# Patient Record
Sex: Male | Born: 1940 | Race: White | Hispanic: No | Marital: Married | Smoking: Former smoker
Health system: Southern US, Community
[De-identification: ages and names within clinical notes are randomized; demographics above are authoritative.]

## PROBLEM LIST (undated history)

## (undated) DIAGNOSIS — F6381 Intermittent explosive disorder: Secondary | ICD-10-CM

## (undated) DIAGNOSIS — D6851 Activated protein C resistance: Secondary | ICD-10-CM

## (undated) DIAGNOSIS — M1990 Unspecified osteoarthritis, unspecified site: Secondary | ICD-10-CM

## (undated) DIAGNOSIS — Z5181 Encounter for therapeutic drug level monitoring: Secondary | ICD-10-CM

## (undated) DIAGNOSIS — Z7901 Long term (current) use of anticoagulants: Secondary | ICD-10-CM

## (undated) DIAGNOSIS — I2510 Atherosclerotic heart disease of native coronary artery without angina pectoris: Secondary | ICD-10-CM

## (undated) DIAGNOSIS — F329 Major depressive disorder, single episode, unspecified: Secondary | ICD-10-CM

## (undated) DIAGNOSIS — G4700 Insomnia, unspecified: Secondary | ICD-10-CM

## (undated) DIAGNOSIS — F909 Attention-deficit hyperactivity disorder, unspecified type: Secondary | ICD-10-CM

## (undated) DIAGNOSIS — E119 Type 2 diabetes mellitus without complications: Secondary | ICD-10-CM

## (undated) DIAGNOSIS — I10 Essential (primary) hypertension: Secondary | ICD-10-CM

## (undated) DIAGNOSIS — E299 Testicular dysfunction, unspecified: Secondary | ICD-10-CM

## (undated) DIAGNOSIS — Z8619 Personal history of other infectious and parasitic diseases: Secondary | ICD-10-CM

## (undated) DIAGNOSIS — G4733 Obstructive sleep apnea (adult) (pediatric): Secondary | ICD-10-CM

## (undated) DIAGNOSIS — J449 Chronic obstructive pulmonary disease, unspecified: Secondary | ICD-10-CM

## (undated) DIAGNOSIS — E785 Hyperlipidemia, unspecified: Secondary | ICD-10-CM

## (undated) DIAGNOSIS — F32A Depression, unspecified: Secondary | ICD-10-CM

## (undated) DIAGNOSIS — U071 COVID-19: Secondary | ICD-10-CM

## (undated) HISTORY — DX: Encounter for therapeutic drug level monitoring: Z51.81

## (undated) HISTORY — DX: Chronic obstructive pulmonary disease, unspecified: J44.9

## (undated) HISTORY — DX: Obstructive sleep apnea (adult) (pediatric): G47.33

## (undated) HISTORY — DX: Atherosclerotic heart disease of native coronary artery without angina pectoris: I25.10

## (undated) HISTORY — DX: Activated protein C resistance: D68.51

## (undated) HISTORY — DX: Unspecified osteoarthritis, unspecified site: M19.90

## (undated) HISTORY — DX: COVID-19: U07.1

## (undated) HISTORY — DX: Intermittent explosive disorder: F63.81

## (undated) HISTORY — DX: Long term (current) use of anticoagulants: Z79.01

## (undated) HISTORY — PX: HEMORRHOID SURGERY: SHX153

## (undated) HISTORY — DX: Essential (primary) hypertension: I10

## (undated) HISTORY — DX: Testicular dysfunction, unspecified: E29.9

## (undated) HISTORY — DX: Depression, unspecified: F32.A

## (undated) HISTORY — DX: Type 2 diabetes mellitus without complications: E11.9

## (undated) HISTORY — DX: Hyperlipidemia, unspecified: E78.5

## (undated) HISTORY — DX: Attention-deficit hyperactivity disorder, unspecified type: F90.9

## (undated) HISTORY — DX: Insomnia, unspecified: G47.00

## (undated) HISTORY — DX: Personal history of other infectious and parasitic diseases: Z86.19

## (undated) HISTORY — PX: UVULOPLASTY: SHX6557

## (undated) HISTORY — PX: BACK SURGERY: SHX140

## (undated) HISTORY — DX: Major depressive disorder, single episode, unspecified: F32.9

---

## 1993-03-10 HISTORY — PX: CORONARY ANGIOPLASTY WITH STENT PLACEMENT: SHX49

## 2005-05-19 ENCOUNTER — Ambulatory Visit: Payer: Self-pay | Admitting: General Surgery

## 2008-07-17 ENCOUNTER — Ambulatory Visit: Payer: Self-pay | Admitting: Unknown Physician Specialty

## 2010-01-23 ENCOUNTER — Inpatient Hospital Stay: Payer: Self-pay | Admitting: Internal Medicine

## 2010-04-29 ENCOUNTER — Ambulatory Visit: Payer: Self-pay | Admitting: Family Medicine

## 2011-07-04 ENCOUNTER — Emergency Department: Payer: Self-pay | Admitting: *Deleted

## 2011-07-04 LAB — COMPREHENSIVE METABOLIC PANEL
Albumin: 4.4 g/dL (ref 3.4–5.0)
Alkaline Phosphatase: 56 U/L (ref 50–136)
Anion Gap: 6 — ABNORMAL LOW (ref 7–16)
Bilirubin,Total: 1.4 mg/dL — ABNORMAL HIGH (ref 0.2–1.0)
Calcium, Total: 8.9 mg/dL (ref 8.5–10.1)
Osmolality: 278 (ref 275–301)
Potassium: 4.7 mmol/L (ref 3.5–5.1)
SGOT(AST): 48 U/L — ABNORMAL HIGH (ref 15–37)
Sodium: 138 mmol/L (ref 136–145)

## 2011-07-04 LAB — PROTIME-INR: INR: 2.2

## 2011-07-04 LAB — CBC
HCT: 41.8 % (ref 40.0–52.0)
MCH: 29.3 pg (ref 26.0–34.0)
MCHC: 32.3 g/dL (ref 32.0–36.0)
MCV: 91 fL (ref 80–100)
Platelet: 229 10*3/uL (ref 150–440)
RDW: 14 % (ref 11.5–14.5)
WBC: 10.1 10*3/uL (ref 3.8–10.6)

## 2011-07-04 LAB — APTT: Activated PTT: 41.7 secs — ABNORMAL HIGH (ref 23.6–35.9)

## 2011-07-05 ENCOUNTER — Emergency Department: Payer: Self-pay | Admitting: Emergency Medicine

## 2011-11-24 ENCOUNTER — Emergency Department: Payer: Self-pay | Admitting: *Deleted

## 2011-11-24 LAB — COMPREHENSIVE METABOLIC PANEL
Albumin: 4.1 g/dL (ref 3.4–5.0)
Alkaline Phosphatase: 78 U/L (ref 50–136)
Anion Gap: 8 (ref 7–16)
BUN: 18 mg/dL (ref 7–18)
Bilirubin,Total: 1.8 mg/dL — ABNORMAL HIGH (ref 0.2–1.0)
Calcium, Total: 9.2 mg/dL (ref 8.5–10.1)
Chloride: 103 mmol/L (ref 98–107)
Creatinine: 1 mg/dL (ref 0.60–1.30)
EGFR (Non-African Amer.): >60
Glucose: 122 mg/dL — ABNORMAL HIGH (ref 65–99)
Potassium: 4.7 mmol/L (ref 3.5–5.1)
SGOT(AST): 54 U/L — ABNORMAL HIGH (ref 15–37)
SGPT (ALT): 42 U/L (ref 12–78)
Total Protein: 7.9 g/dL (ref 6.4–8.2)

## 2011-11-24 LAB — CLOSTRIDIUM DIFFICILE BY PCR

## 2011-11-24 LAB — CBC
HGB: 13.7 g/dL (ref 13.0–18.0)
MCH: 30 pg (ref 26.0–34.0)
RBC: 4.58 10*6/uL (ref 4.40–5.90)
RDW: 14.6 % — ABNORMAL HIGH (ref 11.5–14.5)

## 2011-11-24 LAB — PROTIME-INR
INR: 1.4
Prothrombin Time: 17.2 secs — ABNORMAL HIGH (ref 11.5–14.7)

## 2011-12-15 LAB — STOOL CULTURE

## 2013-04-08 ENCOUNTER — Other Ambulatory Visit: Payer: Self-pay | Admitting: Gastroenterology

## 2013-04-08 LAB — CLOSTRIDIUM DIFFICILE(ARMC)

## 2013-04-09 LAB — WBCS, STOOL

## 2013-04-10 LAB — STOOL CULTURE

## 2013-04-23 IMAGING — US US EXTREM LOW VENOUS*L*
1 series · 17 of 24 positions shown · non-contrast
Comparison: none

REASON FOR EXAM: painful LLL, hx DVT
COMMENTS:

PROCEDURE:     US  - US DOPPLER LOW EXTR LEFT  - July 04, 2011  [DATE]
RESULT:     Comparison: None

[Series 1: us extrem low venous*left* · 17 of 52 slices shown]
[im 1/52]
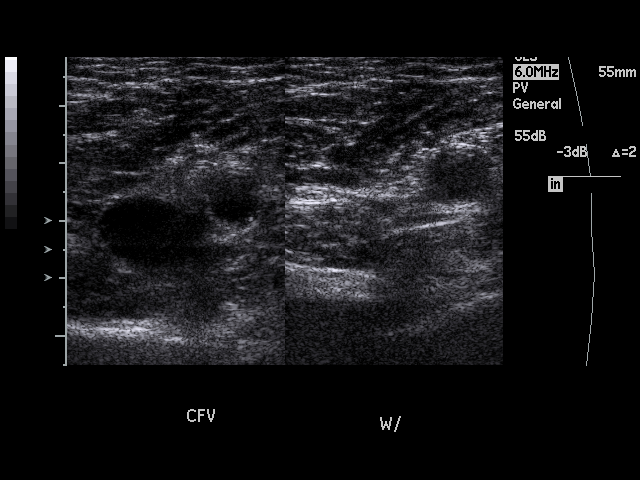
[im 5/52]
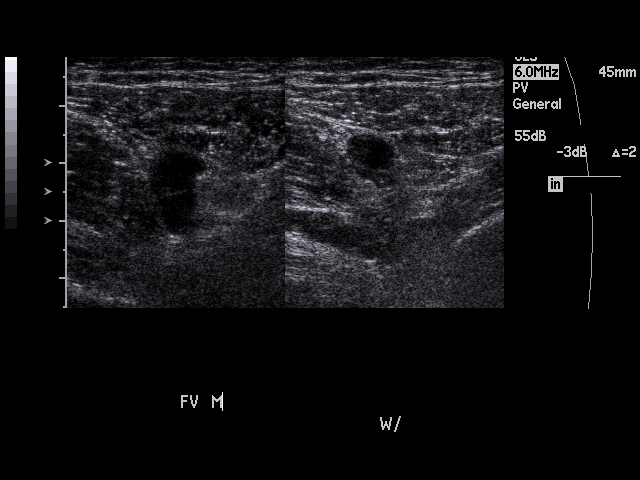
[im 7/52]
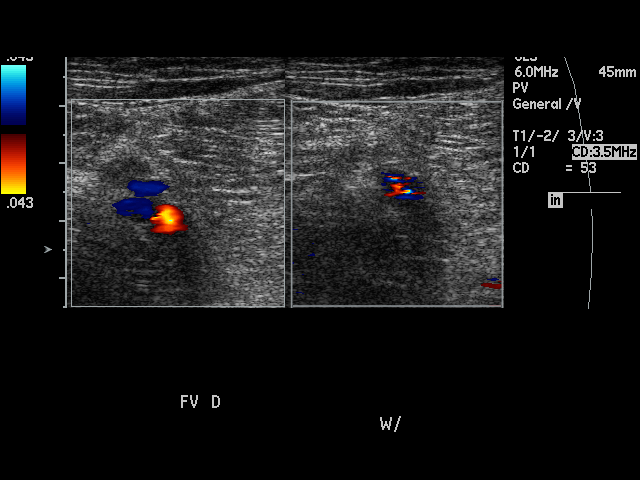
[im 9/52]
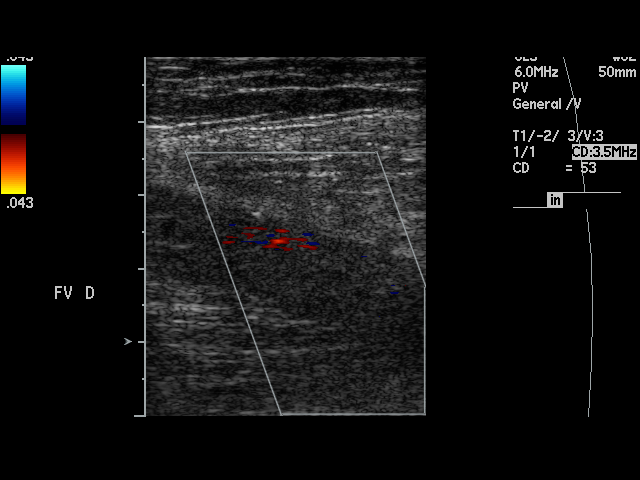
[im 14/52]
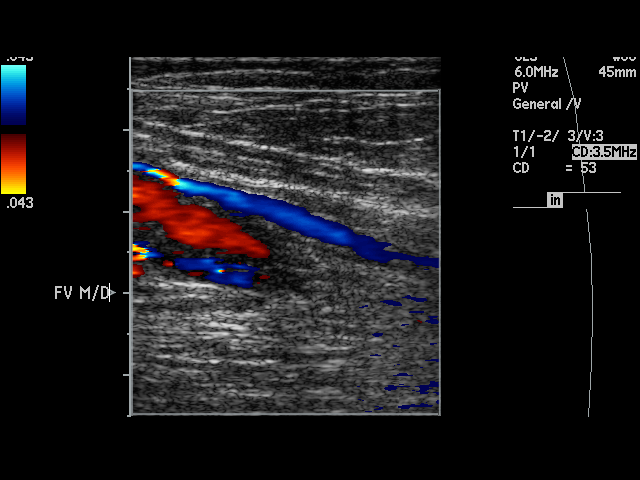
[im 16/52]
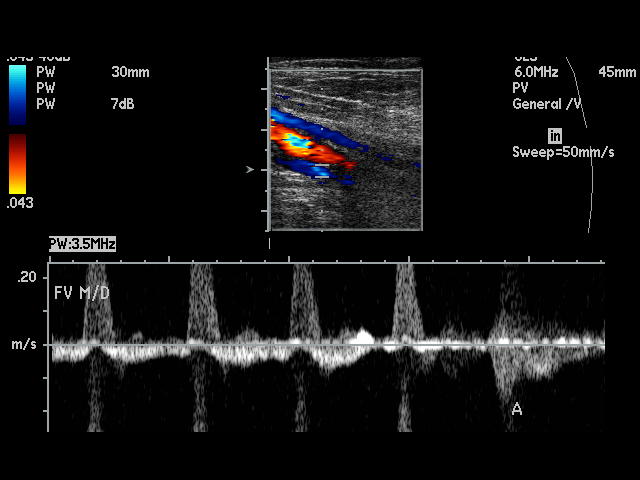
[im 20/52]
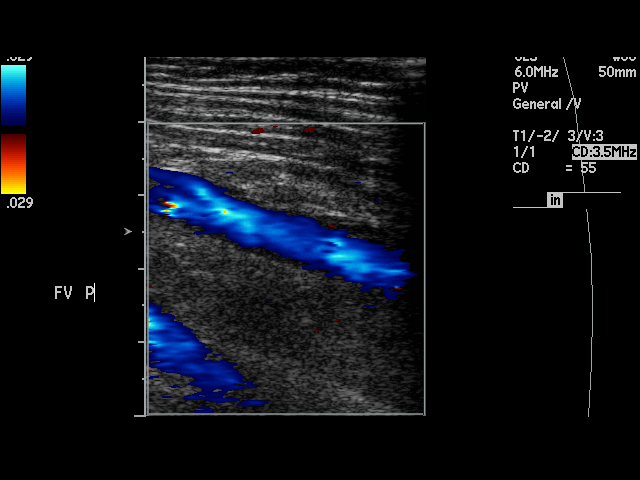
[im 23/52]
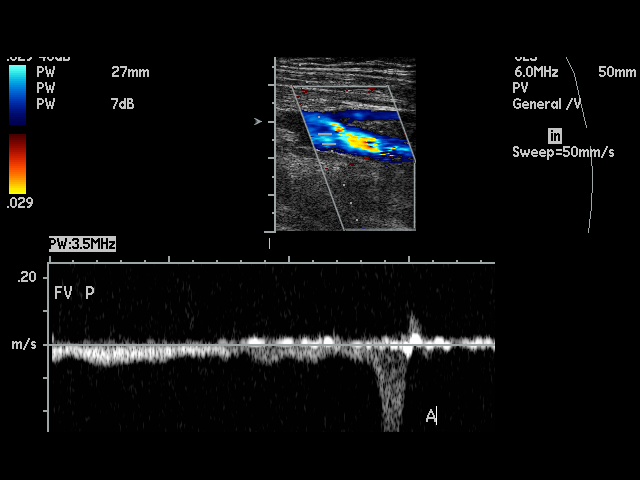
[im 27/52]
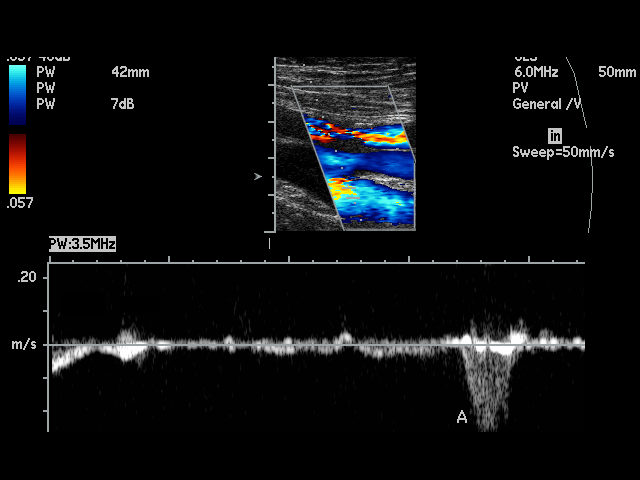
[im 29/52]
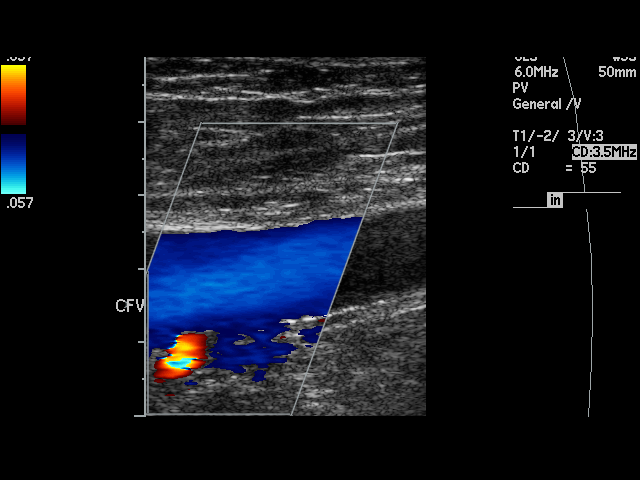
[im 32/52]
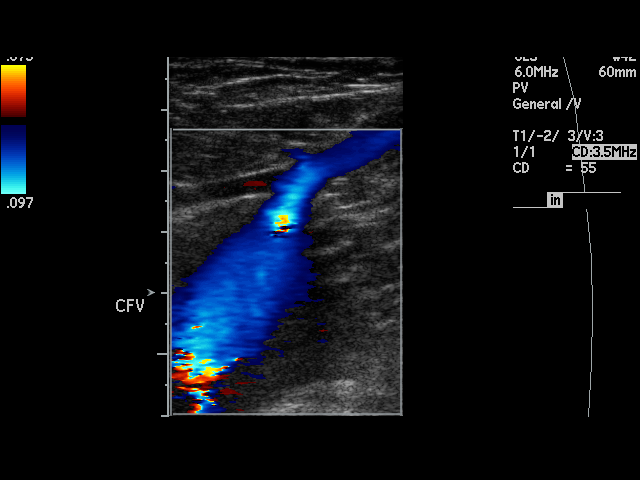
[im 36/52]
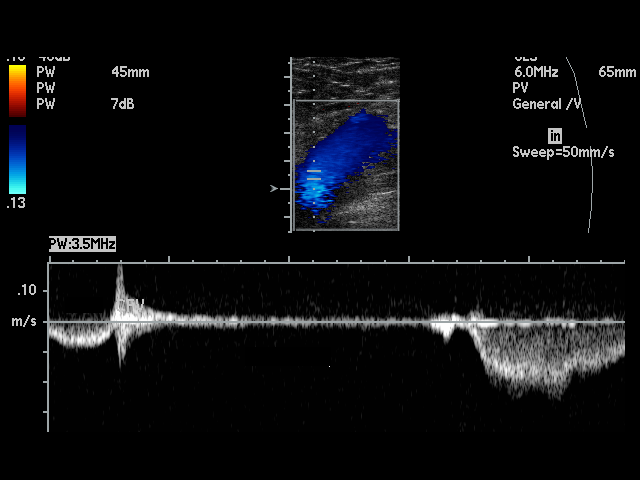
[im 38/52]
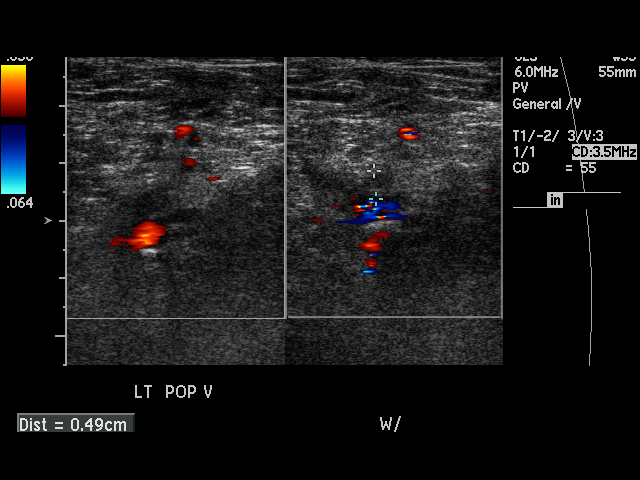
[im 43/52]
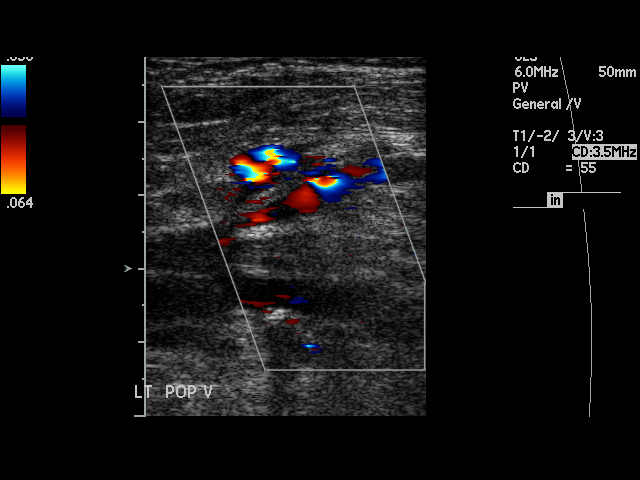
[im 45/52]
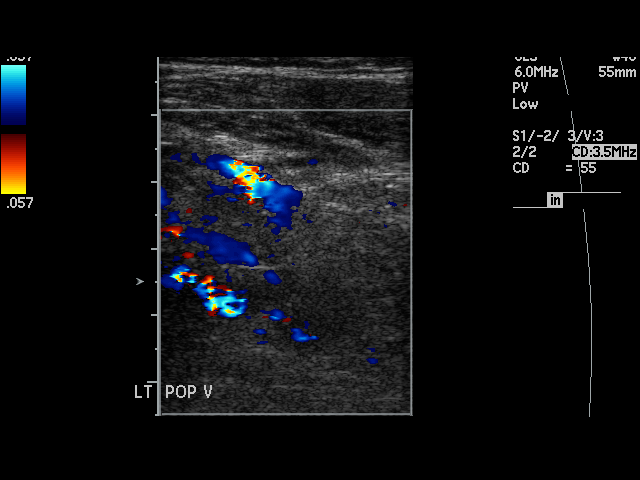
[im 47/52]
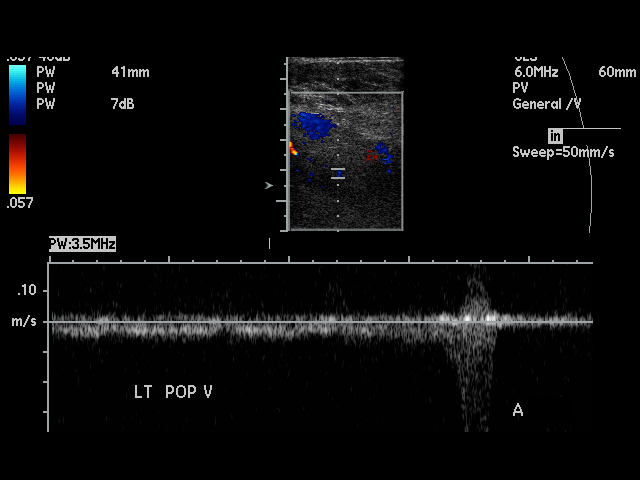
[im 52/52]
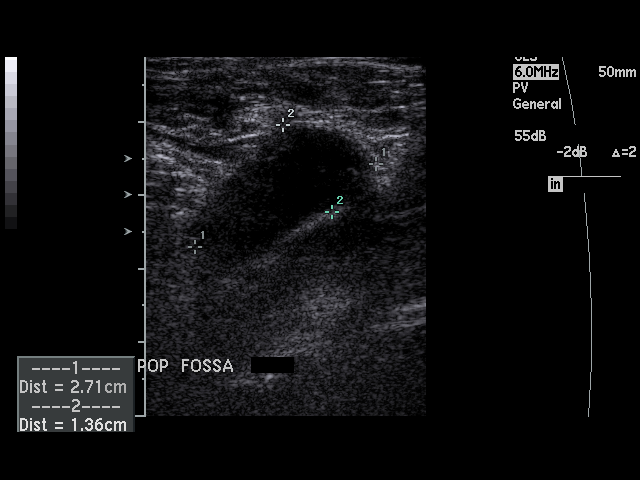

[17 of 24 positions shown; findings below may reference images not displayed]

FINDINGS: Multiple longitudinal and transverse gray-scale as well as color
and spectral Doppler images of the left lower extremity veins were obtained
from the common femoral veins through the popliteal veins.

In the left common femoral vein and popliteal vein are normally compressible
without evidence of thrombus. The superficial femoral vein is partially
compressible with color flow demonstrated to this region consistent with a
nonocclusive thrombus. There are numerous collateral vessels in this region.
There is normal respiratory variation and augmentation demonstrated at all
vein levels.

There is a 2.7 cm hypoechoic avascular popliteal fossa mass consistent with
a Baker's cyst.
IMPRESSION: The left superficial femoral vein is partially compressible with color flow
demonstrated likely resenting a nonocclusive thrombus. An acute versus
chronic thrombus is difficult to delineate on this exam. There are numerous
collateral vessels present.

Left Baker's cyst.

## 2013-04-24 IMAGING — CR DG SHOULDER 3+V*R*
1 series · 3 of 3 positions shown · non-contrast
Comparison: None

REASON FOR EXAM: pain unknown injury  -  ed waiting room
COMMENTS:   LMP: (Male)

PROCEDURE:     DXR - DXR SHOULDER RIGHT COMPLETE  - July 05, 2011 [DATE]
RESULT:     History: Pain

[Series 1: w shoulder external right · 0.14mm/px · 3 of 3 slices shown]
[im 1/3]
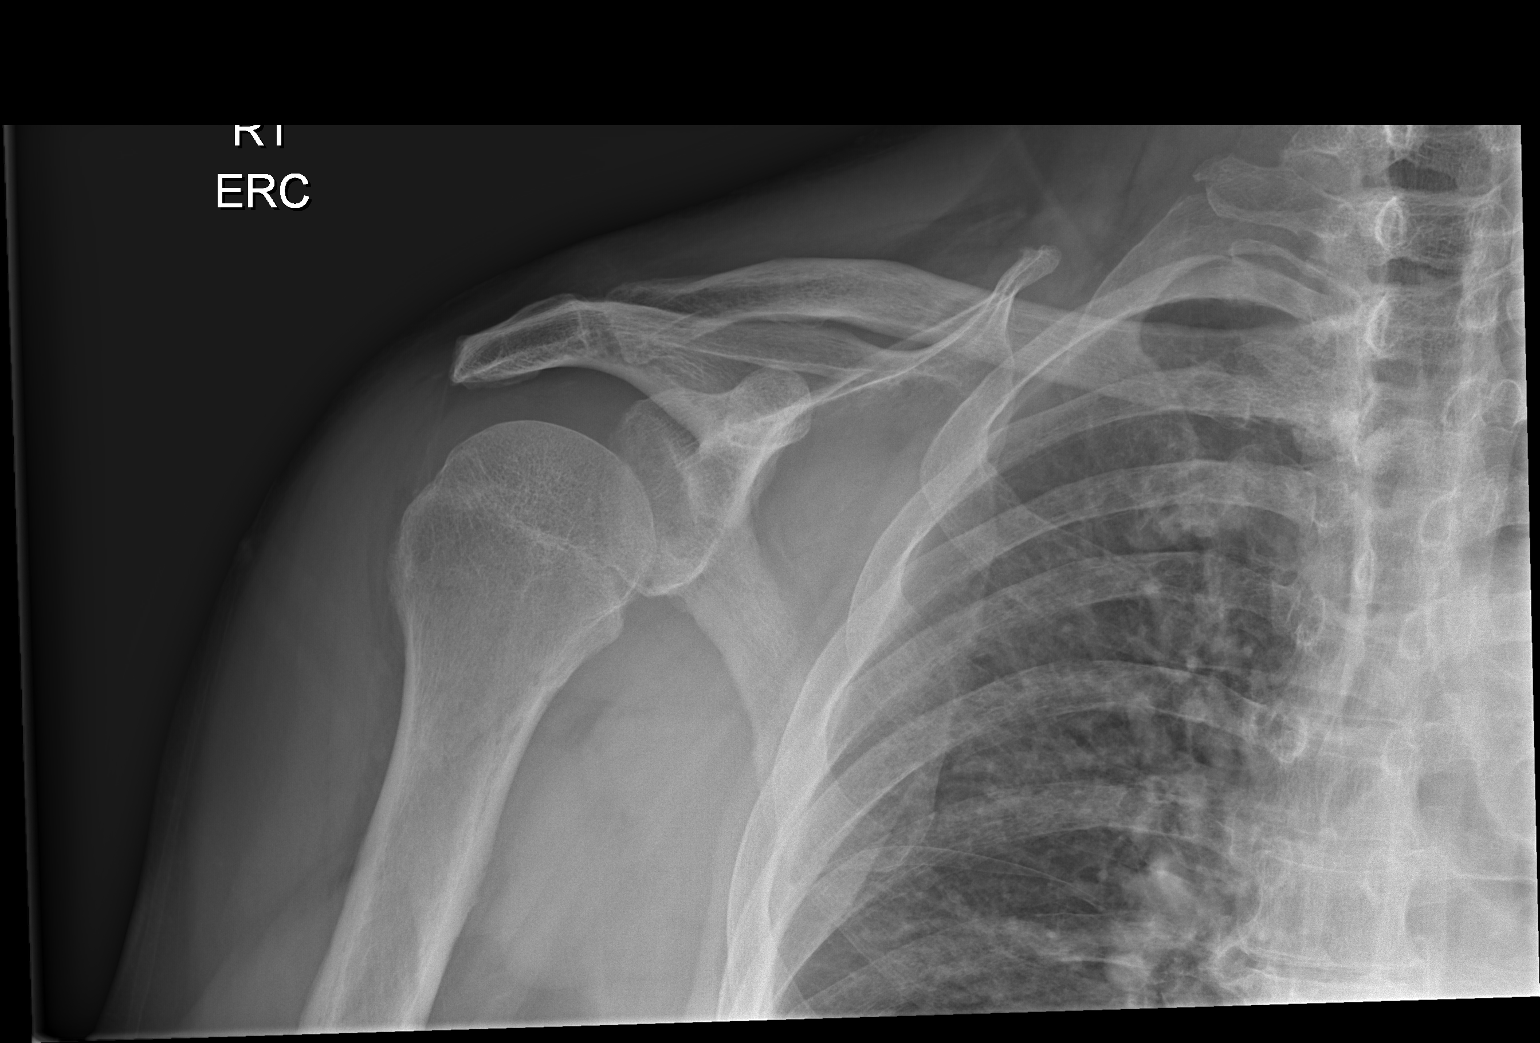
[im 2/3]
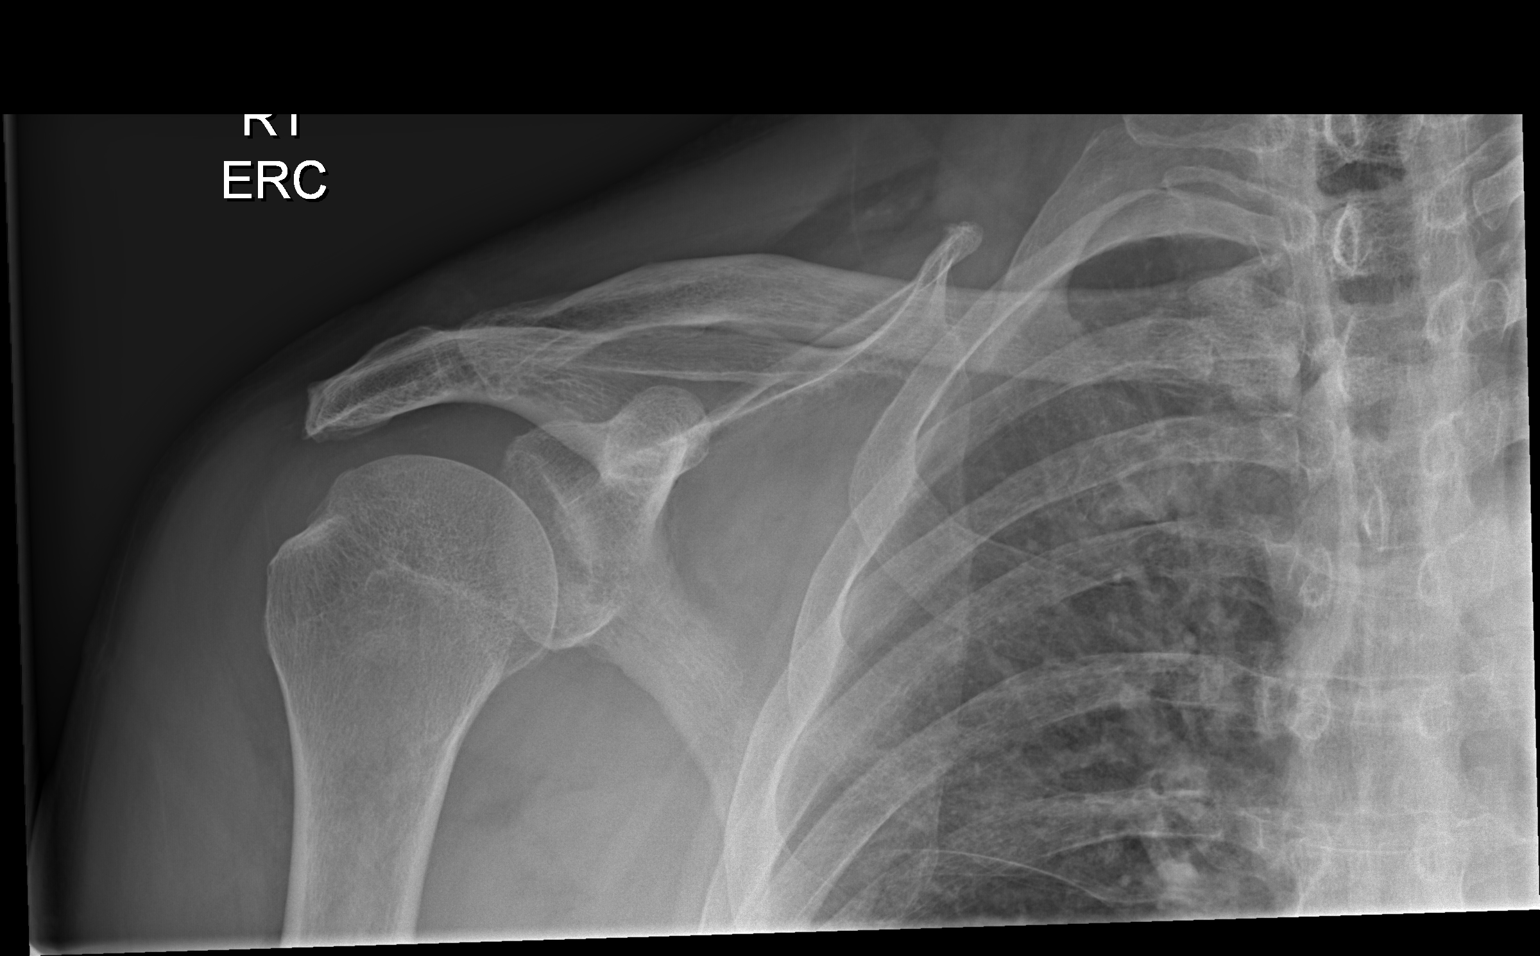
[im 3/3]
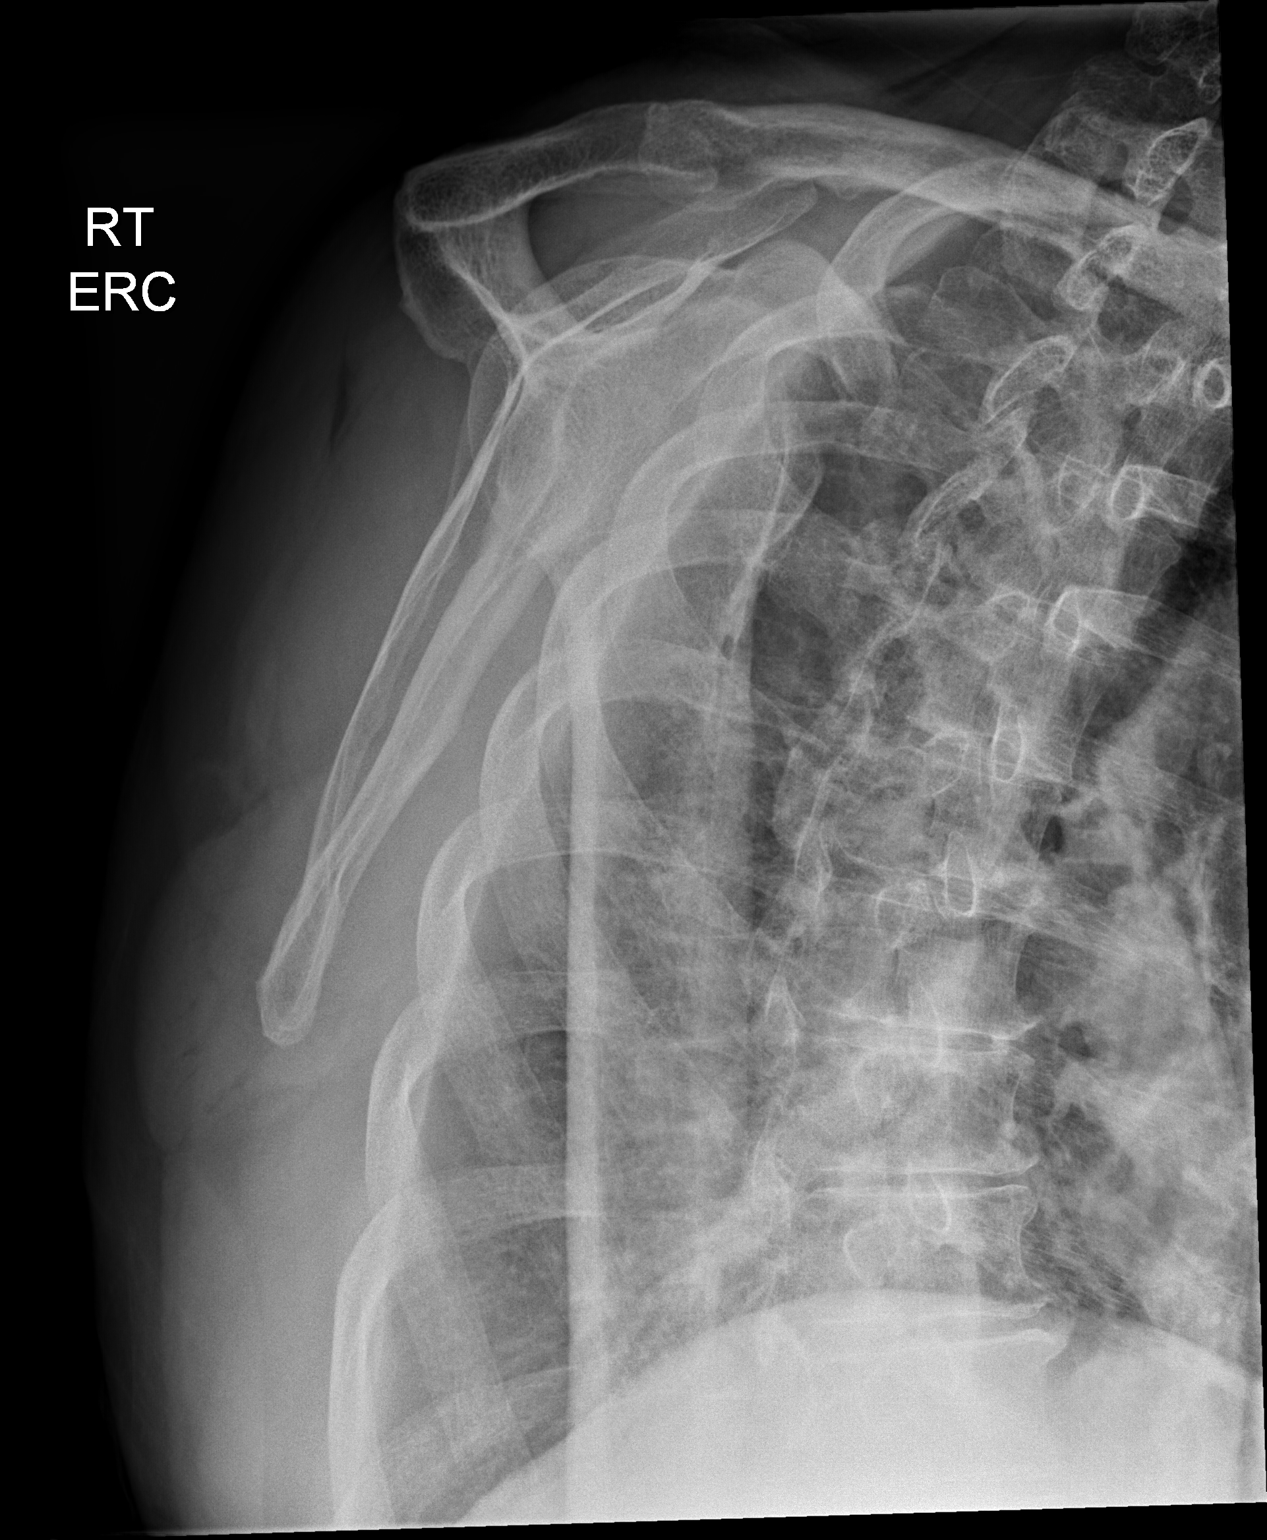

[3 of 3 positions shown; findings below may reference images not displayed]

FINDINGS: 3 views of the right shoulder demonstrates no fracture or dislocation. The
acromioclavicular joint is normal.
IMPRESSION: No acute osseous injury of the right shoulder.

[REDACTED]

## 2013-05-09 ENCOUNTER — Other Ambulatory Visit: Payer: Self-pay | Admitting: Gastroenterology

## 2013-05-09 LAB — CLOSTRIDIUM DIFFICILE(ARMC)

## 2014-04-25 DIAGNOSIS — Z7901 Long term (current) use of anticoagulants: Secondary | ICD-10-CM | POA: Diagnosis not present

## 2014-04-25 DIAGNOSIS — D6851 Activated protein C resistance: Secondary | ICD-10-CM | POA: Diagnosis not present

## 2014-04-25 DIAGNOSIS — Z5181 Encounter for therapeutic drug level monitoring: Secondary | ICD-10-CM | POA: Diagnosis not present

## 2014-04-25 DIAGNOSIS — E1165 Type 2 diabetes mellitus with hyperglycemia: Secondary | ICD-10-CM | POA: Diagnosis not present

## 2014-05-30 DIAGNOSIS — D6851 Activated protein C resistance: Secondary | ICD-10-CM | POA: Diagnosis not present

## 2014-07-31 DIAGNOSIS — D6851 Activated protein C resistance: Secondary | ICD-10-CM | POA: Diagnosis not present

## 2014-08-01 DIAGNOSIS — E1149 Type 2 diabetes mellitus with other diabetic neurological complication: Secondary | ICD-10-CM | POA: Diagnosis not present

## 2014-08-01 DIAGNOSIS — M5417 Radiculopathy, lumbosacral region: Secondary | ICD-10-CM | POA: Diagnosis not present

## 2014-08-01 DIAGNOSIS — D72829 Elevated white blood cell count, unspecified: Secondary | ICD-10-CM | POA: Diagnosis not present

## 2014-08-01 DIAGNOSIS — E299 Testicular dysfunction, unspecified: Secondary | ICD-10-CM | POA: Diagnosis not present

## 2014-08-01 DIAGNOSIS — I10 Essential (primary) hypertension: Secondary | ICD-10-CM | POA: Diagnosis not present

## 2014-08-01 DIAGNOSIS — G629 Polyneuropathy, unspecified: Secondary | ICD-10-CM | POA: Diagnosis not present

## 2014-08-01 DIAGNOSIS — R0602 Shortness of breath: Secondary | ICD-10-CM | POA: Diagnosis not present

## 2014-08-01 DIAGNOSIS — E780 Pure hypercholesterolemia: Secondary | ICD-10-CM | POA: Diagnosis not present

## 2014-08-21 ENCOUNTER — Telehealth: Payer: Self-pay | Admitting: Family Medicine

## 2014-08-21 NOTE — Telephone Encounter (Signed)
Ok to stop Coumadin for 5 days for colonoscopy

## 2014-08-21 NOTE — Telephone Encounter (Signed)
Requesting a statement to be written for him to give to the [place] to get off of coumidan for 5 days prior to his operation (colonoscopy) which is scheduled for August 29, 2014

## 2014-08-26 ENCOUNTER — Other Ambulatory Visit: Payer: Self-pay | Admitting: Family Medicine

## 2014-10-05 ENCOUNTER — Telehealth: Payer: Self-pay | Admitting: Family Medicine

## 2014-10-05 DIAGNOSIS — D6851 Activated protein C resistance: Secondary | ICD-10-CM | POA: Diagnosis not present

## 2014-10-05 DIAGNOSIS — Z7901 Long term (current) use of anticoagulants: Secondary | ICD-10-CM

## 2014-10-05 NOTE — Telephone Encounter (Signed)
[facility] went to [facility] today and did his PT/INR, however he is requesting a standing order to be sent over for next month. Patient did schedule appointment for this coming Wednesday

## 2014-10-06 NOTE — Telephone Encounter (Signed)
Standing order placed for once per month

## 2014-10-10 ENCOUNTER — Other Ambulatory Visit: Payer: Self-pay | Admitting: Family Medicine

## 2014-10-11 ENCOUNTER — Encounter: Payer: Self-pay | Admitting: Family Medicine

## 2014-10-11 ENCOUNTER — Ambulatory Visit
Admission: RE | Admit: 2014-10-11 | Discharge: 2014-10-11 | Disposition: A | Discharge status: Home / Self Care | Payer: Commercial Managed Care - HMO | Source: Ambulatory Visit | Attending: Family Medicine | Admitting: Family Medicine

## 2014-10-11 ENCOUNTER — Ambulatory Visit (INDEPENDENT_AMBULATORY_CARE_PROVIDER_SITE_OTHER): Payer: Commercial Managed Care - HMO | Admitting: Family Medicine

## 2014-10-11 VITALS — BP 106/60 | HR 80 | Temp 97.6°F | Resp 16 | Ht 73.0 in | Wt 218.1 lb

## 2014-10-11 DIAGNOSIS — R0602 Shortness of breath: Secondary | ICD-10-CM | POA: Insufficient documentation

## 2014-10-11 DIAGNOSIS — I10 Essential (primary) hypertension: Secondary | ICD-10-CM | POA: Diagnosis not present

## 2014-10-11 DIAGNOSIS — E785 Hyperlipidemia, unspecified: Secondary | ICD-10-CM | POA: Diagnosis not present

## 2014-10-11 DIAGNOSIS — E1140 Type 2 diabetes mellitus with diabetic neuropathy, unspecified: Secondary | ICD-10-CM | POA: Diagnosis not present

## 2014-10-11 DIAGNOSIS — E1121 Type 2 diabetes mellitus with diabetic nephropathy: Secondary | ICD-10-CM | POA: Diagnosis not present

## 2014-10-11 DIAGNOSIS — E1165 Type 2 diabetes mellitus with hyperglycemia: Secondary | ICD-10-CM

## 2014-10-11 DIAGNOSIS — IMO0002 Reserved for concepts with insufficient information to code with codable children: Secondary | ICD-10-CM | POA: Insufficient documentation

## 2014-10-11 DIAGNOSIS — D6851 Activated protein C resistance: Secondary | ICD-10-CM

## 2014-10-11 LAB — GLUCOSE, POCT (MANUAL RESULT ENTRY): POC Glucose: 123 mg/dl — AB (ref 70–99)

## 2014-10-11 LAB — POCT GLYCOSYLATED HEMOGLOBIN (HGB A1C): Hemoglobin A1C: 6.4

## 2014-10-11 MED ORDER — WARFARIN SODIUM 5 MG PO TABS: ORAL_TABLET | ORAL | Status: DC

## 2014-10-11 MED ORDER — ATORVASTATIN CALCIUM 20 MG PO TABS: 20.0000 mg | ORAL_TABLET | Freq: Every day | ORAL | Status: DC

## 2014-10-11 MED ORDER — METFORMIN HCL 1000 MG PO TABS: 1000.0000 mg | ORAL_TABLET | Freq: Two times a day (BID) | ORAL | Status: DC

## 2014-10-11 MED ORDER — LISINOPRIL 40 MG PO TABS: 40.0000 mg | ORAL_TABLET | Freq: Every day | ORAL | Status: DC

## 2014-10-11 MED ORDER — AMLODIPINE BESYLATE 5 MG PO TABS: 5.0000 mg | ORAL_TABLET | Freq: Every day | ORAL | Status: DC

## 2014-10-11 MED ORDER — ATENOLOL 50 MG PO TABS: 50.0000 mg | ORAL_TABLET | Freq: Two times a day (BID) | ORAL | Status: DC

## 2014-10-11 MED ORDER — GLIPIZIDE ER 10 MG PO TB24: 10.0000 mg | ORAL_TABLET | Freq: Two times a day (BID) | ORAL | Status: DC

## 2014-10-11 NOTE — Progress Notes (Signed)
Name: Drew Morgan   MRN: [id number]    DOB: 05-12-1940   Date:10/11/2014       Progress Note  Subjective  Chief Complaint  Chief Complaint  Patient presents with  . Diabetes  . Insomnia  . Hypertension  . Depression  . Hyperlipidemia    Diabetes He presents for his follow-up diabetic visit. His disease course has been improving. Hypoglycemia symptoms include nervousness/anxiousness. Pertinent negatives for hypoglycemia include no dizziness, headaches, seizures or tremors. Associated symptoms include foot paresthesias. Pertinent negatives for diabetes include no blurred vision, no chest pain, no weakness and no weight loss. Symptoms are improving. Diabetic complications include nephropathy and peripheral neuropathy. Risk factors for coronary artery disease include diabetes mellitus, dyslipidemia, hypertension, male sex, sedentary lifestyle and stress. Current diabetic treatment includes oral agent (dual therapy). He is compliant with treatment most of the time. His weight is stable. He is following a diabetic diet. He rarely participates in exercise. His home blood glucose trend is decreasing steadily. His overall blood glucose range is 110-130 mg/dl. An ACE inhibitor/angiotensin II receptor blocker is being taken.  Hypertension This is a chronic problem. The current episode started more than 1 year ago. The problem is unchanged. The problem is controlled. Associated symptoms include malaise/fatigue and shortness of breath. Pertinent negatives include no blurred vision, chest pain, headaches, neck pain, orthopnea or palpitations. Past treatments include ACE inhibitors.  Hyperlipidemia This is a chronic problem. The current episode started more than 1 year ago. The problem is controlled. Recent lipid tests were reviewed and are normal. Factors aggravating his hyperlipidemia include fatty foods. Associated symptoms include myalgias and shortness of breath. Pertinent negatives include no chest pain  or focal weakness. Current antihyperlipidemic treatment includes statins. The current treatment provides moderate improvement of lipids. Compliance problems include adherence to diet.     Shortness of breath     He is not a smoker currently but has smoked in the past.Patient complains intermittently of some mild dyspnea. Is not particularly with exertion. There is no wheezing and no significant cough.   Past Medical History  Diagnosis Date  . Depression   . Diabetes mellitus without complication   . Hyperlipidemia   . Hypertension   . Insomnia   . Testicular dysfunction     History  Substance Use Topics  . Smoking status: Former Games developer  . Smokeless tobacco: Not on file  . Alcohol Use: No     Current outpatient prescriptions:  .  amLODipine (NORVASC) 5 MG tablet, Take 1 tablet (5 mg total) by mouth daily., Disp: 90 tablet, Rfl: 1 .  atenolol (TENORMIN) 50 MG tablet, Take 1 tablet (50 mg total) by mouth 2 (two) times daily., Disp: 180 tablet, Rfl: 1 .  atorvastatin (LIPITOR) 20 MG tablet, Take 1 tablet (20 mg total) by mouth daily., Disp: 90 tablet, Rfl: 1 .  glipiZIDE (GLUCOTROL XL) 10 MG 24 hr tablet, Take 1 tablet (10 mg total) by mouth 2 (two) times daily at 10 AM and 5 PM., Disp: 180 tablet, Rfl: 1 .  lisinopril (PRINIVIL,ZESTRIL) 40 MG tablet, Take 1 tablet (40 mg total) by mouth daily., Disp: 90 tablet, Rfl: 1 .  metFORMIN (GLUCOPHAGE) 1000 MG tablet, Take 1 tablet (1,000 mg total) by mouth 2 (two) times daily., Disp: 180 tablet, Rfl: 1 .  warfarin (COUMADIN) 5 MG tablet, TAKE 1 AND 1/2 TABLETS EVERY DAY, Disp: 135 tablet, Rfl: 1  No Known Allergies  Review of Systems  Constitutional: Positive for malaise/fatigue.  Negative for fever, chills and weight loss.  HENT: Negative for congestion, hearing loss, sore throat and tinnitus.   Eyes: Negative for blurred vision, double vision and redness.  Respiratory: Positive for shortness of breath. Negative for cough, hemoptysis and  wheezing.   Cardiovascular: Negative for chest pain, palpitations, orthopnea, claudication and leg swelling.  Gastrointestinal: Negative for heartburn, nausea, vomiting, diarrhea, constipation and blood in stool.  Genitourinary: Negative for dysuria, urgency, frequency and hematuria.  Musculoskeletal: Positive for myalgias, back pain and joint pain. Negative for falls and neck pain.  Skin: Negative for itching.  Neurological: Positive for sensory change. Negative for dizziness, tingling, tremors, focal weakness, seizures, loss of consciousness, weakness and headaches.  Endo/Heme/Allergies: Does not bruise/bleed easily.  Psychiatric/Behavioral: Positive for depression. Negative for substance abuse. The patient is nervous/anxious and has insomnia.         Objective  Filed Vitals:   10/11/14 0821  BP: 106/60  Pulse: 80  Temp: 97.6 F (36.4 C)  Resp: 16  Height:  (1.854 m)  Weight: 218 lb 2 oz (98.941 kg)  SpO2: 96%     Physical Exam  Constitutional: He is oriented to person, place, and time and well-developed, well-nourished, and in no distress.  Anxious and fidgety   HENT:  Head: Normocephalic.  Eyes: EOM are normal. Pupils are equal, round, and reactive to light.  Neck: Normal range of motion. Neck supple. No thyromegaly present.  Cardiovascular: Normal rate, regular rhythm and normal heart sounds.   No murmur heard. Pulmonary/Chest: Effort normal and breath sounds normal. No respiratory distress. He has no wheezes.  Abdominal: Soft. Bowel sounds are normal.  Musculoskeletal: Normal range of motion. He exhibits tenderness. He exhibits no edema.  Lymphadenopathy:    He has no cervical adenopathy.  Neurological: He is alert and oriented to person, place, and time. No cranial nerve deficit. Gait normal. Coordination normal.  Skin: Skin is warm and dry. No rash noted.  Psychiatric: Judgment normal.  Anxious and loquacious      Assessment & Plan  1. Type 2 diabetes  mellitus with diabetic neuropathy  - POCT HgB A1C - POCT Glucose (CBG) - metFORMIN (GLUCOPHAGE) 1000 MG tablet; Take 1 tablet (1,000 mg total) by mouth 2 (two) times daily.  Dispense: 180 tablet; Refill: 1 - glipiZIDE (GLUCOTROL XL) 10 MG 24 hr tablet; Take 1 tablet (10 mg total) by mouth 2 (two) times daily at 10 AM and 5 PM.  Dispense: 180 tablet; Refill: 1  2. Essential hypertension  - at- lisinopril (PRINIVIL,ZESTRIL) 40 MG tablet; Take 1 tablet (40 mg total) by mouth daily.  Dispense: 90 tablet; Refill: 1enolol (TENORMIN) 50 MG tablet; Take 1 tablet (50 mg total) by mouth 2 (two) times daily.  Dispense: 180 tablet; Refill: 1 - amLODipine (NORVASC) 5 MG tablet; Take 1 tablet (5 mg total) by mouth daily.  Dispense: 90 tablet; Refill: 1 - TSH  3. Hyperlipidemia   - atorvastatin (LIPITOR) 20 MG tablet; Take 1 tablet (20 mg total) by mouth daily.  Dispense: 90 tablet; Refill: 1 - Comprehensive Metabolic Panel (CMET) - Lipid Profile - TSH  4. Factor V Leiden mutation  - warfarin (COUMADIN) 5 MG tablet; TAKE 1 AND 1/2 TABLETS EVERY DAY  Dispense: 135 tablet; Refill: 1  5. Diabetic nephropathy associated with type 2 diabetes mellitus   6. Shortness of breath  - DG Chest 2 View; Future

## 2014-10-12 LAB — LIPID PANEL
CHOLESTEROL TOTAL: 198 mg/dL (ref 100–199)
Chol/HDL Ratio: 5.8 ratio units — ABNORMAL HIGH (ref 0.0–5.0)
HDL: 34 mg/dL — ABNORMAL LOW (ref >39)
Triglycerides: 610 mg/dL (ref 0–149)

## 2014-10-12 LAB — COMPREHENSIVE METABOLIC PANEL
ALT: 34 IU/L (ref 0–44)
AST: 28 IU/L (ref 0–40)
Albumin/Globulin Ratio: 1.7 (ref 1.1–2.5)
Albumin: 4.6 g/dL (ref 3.5–4.8)
Alkaline Phosphatase: 63 IU/L (ref 39–117)
BUN / CREAT RATIO: 26 — AB (ref 10–22)
BUN: 29 mg/dL — ABNORMAL HIGH (ref 8–27)
Bilirubin Total: 1 mg/dL (ref 0.0–1.2)
CO2: 19 mmol/L (ref 18–29)
Calcium: 9.3 mg/dL (ref 8.6–10.2)
Chloride: 101 mmol/L (ref 97–108)
Creatinine, Ser: 1.1 mg/dL (ref 0.76–1.27)
GFR calc Af Amer: 78 mL/min/{1.73_m2} (ref >59)
GFR calc non Af Amer: 67 mL/min/{1.73_m2} (ref >59)
Globulin, Total: 2.7 g/dL (ref 1.5–4.5)
Glucose: 123 mg/dL — ABNORMAL HIGH (ref 65–99)
POTASSIUM: 5.6 mmol/L — AB (ref 3.5–5.2)
Sodium: 143 mmol/L (ref 134–144)
TOTAL PROTEIN: 7.3 g/dL (ref 6.0–8.5)

## 2014-10-12 LAB — TSH: TSH: 1.84 u[IU]/mL (ref 0.450–4.500)

## 2014-10-17 ENCOUNTER — Encounter: Payer: Self-pay | Admitting: Family Medicine

## 2014-10-24 ENCOUNTER — Other Ambulatory Visit: Payer: Self-pay

## 2014-10-24 ENCOUNTER — Telehealth: Payer: Self-pay

## 2014-10-24 NOTE — Telephone Encounter (Signed)
Medical alerts for drug interactions

## 2014-10-25 MED ORDER — FENOFIBRATE 145 MG PO TABS: 145.0000 mg | ORAL_TABLET | Freq: Every day | ORAL | Status: DC

## 2014-10-25 NOTE — Telephone Encounter (Signed)
High risk dosage alerts for fenofibrate and warfin please sign off on this

## 2014-11-10 ENCOUNTER — Other Ambulatory Visit: Payer: Self-pay

## 2014-11-10 MED ORDER — FENOFIBRATE 145 MG PO TABS: 145.0000 mg | ORAL_TABLET | Freq: Every day | ORAL | Status: DC

## 2014-12-20 ENCOUNTER — Telehealth: Payer: Self-pay | Admitting: Family Medicine

## 2014-12-20 NOTE — Telephone Encounter (Signed)
PT SAID THAT HE IS NEEDING ALL RX REFILLED AND NEEDS TO BE SENT TO [facility] FAX# [phone]. ATENOLOL 50MG  HE IS OUT TOTALLY AND NEEDS JUST [facility] DAY SU[facility]PL[facility] AND THE OTHER [facility] BE SENT TO [facility]. USE TO TAKE 4 A DAY DOES NOT KNOW HOW IT GOT CHANGED. [facility] [address] [facility] THE [facility] ONLY . THE OTHER WHEN CALLED IN WILL COME BY MAIL FROM [facility].

## 2014-12-22 NOTE — Telephone Encounter (Signed)
Informed patient that atenolol 50mg  1 tablet 2 times daily was [facility] [address] on 12/22/14.  Also informed that dosage couldn't be increased due to lack of documentation by provider.

## 2014-12-22 NOTE — Telephone Encounter (Signed)
Pt said that you all changed his quanity to taking 4 a day that it has been changed awhile back. Please  Call pt back for he is very upset. Pt said call him at [phone]

## 2014-12-22 NOTE — Telephone Encounter (Signed)
Pt was here in August and received 90 day supplies with 1 refill of all medications. Pt should not be out yet, he should have enough to hold him until his appointment in December. Called pt to inform him and left him a message to call the office back. Also pt should be taking atenolol twice per day according to script not 4 times daily.

## 2015-02-08 ENCOUNTER — Other Ambulatory Visit: Payer: Self-pay | Admitting: Family Medicine

## 2015-02-08 DIAGNOSIS — Z7901 Long term (current) use of anticoagulants: Secondary | ICD-10-CM | POA: Diagnosis not present

## 2015-02-09 LAB — PROTIME-INR
INR: 2.3 — ABNORMAL HIGH (ref 0.8–1.2)
Prothrombin Time: 23.1 s — ABNORMAL HIGH (ref 9.1–12.0)

## 2015-02-12 ENCOUNTER — Telehealth: Payer: Self-pay | Admitting: Emergency Medicine

## 2015-02-12 ENCOUNTER — Encounter: Payer: Self-pay | Admitting: Family Medicine

## 2015-02-12 ENCOUNTER — Ambulatory Visit (INDEPENDENT_AMBULATORY_CARE_PROVIDER_SITE_OTHER): Payer: Commercial Managed Care - HMO | Admitting: Family Medicine

## 2015-02-12 VITALS — BP 118/62 | HR 72 | Temp 97.9°F | Resp 18 | Ht 73.0 in | Wt 219.5 lb

## 2015-02-12 DIAGNOSIS — E1140 Type 2 diabetes mellitus with diabetic neuropathy, unspecified: Secondary | ICD-10-CM

## 2015-02-12 DIAGNOSIS — I10 Essential (primary) hypertension: Secondary | ICD-10-CM | POA: Diagnosis not present

## 2015-02-12 DIAGNOSIS — E785 Hyperlipidemia, unspecified: Secondary | ICD-10-CM | POA: Diagnosis not present

## 2015-02-12 DIAGNOSIS — D6851 Activated protein C resistance: Secondary | ICD-10-CM

## 2015-02-12 DIAGNOSIS — E1169 Type 2 diabetes mellitus with other specified complication: Secondary | ICD-10-CM

## 2015-02-12 LAB — GLUCOSE, POCT (MANUAL RESULT ENTRY): POC GLUCOSE: 163 mg/dL — AB (ref 70–99)

## 2015-02-12 LAB — POCT GLYCOSYLATED HEMOGLOBIN (HGB A1C): HEMOGLOBIN A1C: 6.9

## 2015-02-12 NOTE — Telephone Encounter (Signed)
Patient notified of PT results

## 2015-02-12 NOTE — Progress Notes (Signed)
Name: Drew Morgan   MRN: [id number]    DOB: [birth year]   Date:02/12/2015       Progress Note  Subjective  Chief Complaint  Chief Complaint  Patient presents with  . Hypertension    4 month follow up  . Hyperlipidemia  . Diabetes    HPI  Diabetes  Patient presents for follow-up of diabetes which is present for over 5 years. Is currently on a regimen of glipizide 10 mg daily and metformin thousand milligrams twice a day . Patient states minimal compliance with their diet and exercise. There's been no hypoglycemic episodes and there no polyuria polydipsia polyphagia. His average fasting glucoses been in the low around  - with a high around - . There is no end organ disease.  Last diabetic eye exam was                              earlier this year.   Last visit with dietitian was more in a year ago. Last microalbumin was obtained  50 in February of this year.  Hypertension   Patient presents for follow-up of hypertension. It has been present for over 5 years.  Patient states that there is compliance with medical regimen which consists of amlodipine 5 mg daily atenolol 50 mg daily lisinopril 40 mg daily . There is no end organ disease. Cardiac risk factors include hypertension hyperlipidemia and diabetes.  Exercise regimen consist of minimal walking because of his joint disability .  Diet consist of limit salt  Hyperlipidemia  Patient has a history of hyperlipidemia for over 5 years.  Current medical regimen consist of atorvastatin 20 mg daily at bedtime and  fenofibrate 145 mg daily at bedtime .  Compliance is good .  Diet and exercise are currently followed rarely .  Risk factors for cardiovascular disease include hyperlipidemia diabetes hypertension sedentary lifestyle .   There have been no side effects from the medication.    ASCVD  Patient has a history of atherosclerosis being followed by cardiologist. He is currently being managed medically.  Factor V Leiden  deficiency  Patient currently on a regimen of Coumadin for factor V Leiden deficiency. 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 This is been present for many years. He has had no recent DVT and PE at all. He is usually compliant with medication with a therapeutic INR of 2.2 last week.   Osteoarthritis  Patient has severe osteoarthritis involving multiple joints. He is at the point were knee replacement has been suggested but refused. He has been on narcotic pain medications per the [place].   Past Medical History  Diagnosis Date  . Depression   . Diabetes mellitus without complication (HCC)   . Hyperlipidemia   . Hypertension   . Insomnia   . Testicular dysfunction     Social History  Substance Use Topics  . Smoking status: Former Games developer  . Smokeless tobacco: Not on file  . Alcohol Use: No     Current outpatient prescriptions:  .  meloxicam (MOBIC) 15 MG tablet, Take 15 mg by mouth daily., Disp: , Rfl:  .  amLODipine (NORVASC) 5 MG tablet, Take 1 tablet (5 mg total) by mouth daily., Disp: 90 tablet, Rfl: 1 .  atenolol (TENORMIN) 50 MG tablet, Take 1 tablet (50 mg total) by mouth 2 (two) times daily., Disp: 180 tablet, Rfl: 1 .  atorvastatin (  LIPITOR) 20 MG tablet, Take 1 tablet (20 mg total) by mouth daily., Disp: 90 tablet, Rfl: 1 .  fenofibrate (TRICOR) 145 MG tablet, Take 1 tablet (145 mg total) by mouth daily., Disp: 30 tablet, Rfl: 2 .  glipiZIDE (GLUCOTROL XL) 10 MG 24 hr tablet, Take 1 tablet (10 mg total) by mouth 2 (two) times daily at 10 AM and 5 PM., Disp: 180 tablet, Rfl: 1 .  lisinopril (PRINIVIL,ZESTRIL) 40 MG tablet, Take 1 tablet (40 mg total) by mouth daily., Disp: 90 tablet, Rfl: 1 .  metFORMIN (GLUCOPHAGE) 1000 MG tablet, Take 1 tablet (1,000 mg total) by mouth 2 (two) times daily., Disp: 180 tablet, Rfl: 1 .  warfarin (COUMADIN) 5 MG tablet, TAKE 1 AND 1/2 TABLETS EVERY DAY, Disp: 135 tablet, Rfl: 1  No Known  Allergies  Review of Systems  Constitutional: Positive for malaise/fatigue. Negative for fever, chills and weight loss.  HENT: Negative for congestion, hearing loss, sore throat and tinnitus.   Eyes: Negative for blurred vision, double vision and redness.  Respiratory: Negative for cough, hemoptysis and shortness of breath.   Cardiovascular: Negative for chest pain, palpitations, orthopnea, claudication and leg swelling.  Gastrointestinal: Negative for heartburn, nausea, vomiting, diarrhea, constipation and blood in stool.  Genitourinary: Negative for dysuria, urgency, frequency and hematuria.  Musculoskeletal: Positive for back pain and joint pain (bilateral knee pain.). Negative for myalgias, falls and neck pain.  Skin: Negative for itching.  Neurological: Negative for dizziness, tingling, tremors, focal weakness, seizures, loss of consciousness, weakness and headaches.  Endo/Heme/Allergies: Does not bruise/bleed easily.  Psychiatric/Behavioral: Positive for depression. Negative for suicidal ideas and substance abuse. The patient is nervous/anxious and has insomnia.      Objective  Filed Vitals:   02/12/15 0759  BP: 118/62  Pulse: 72  Temp: 97.9 F (36.6 C)  TempSrc: Oral  Resp: 18  Height: 6\' 1"  (1.854 m)  Weight: 219 lb 8 oz (99.565 kg)  SpO2: 93%     Physical Exam  Constitutional: He is oriented to person, place, and time and well-developed, well-nourished, and in no distress.  HENT:  Head: Normocephalic.  Eyes: EOM are normal. Pupils are equal, round, and reactive to light.  Neck: Normal range of motion. Neck supple. No thyromegaly present.  Cardiovascular: Normal rate, regular rhythm and normal heart sounds.   No murmur heard. Pulmonary/Chest: Effort normal and breath sounds normal. No respiratory distress. He has no wheezes.  Abdominal: Soft. Bowel sounds are normal.  Musculoskeletal: He exhibits no edema.  Bilateral knee pain and hypertrophic changes of  osteoarthritis. Negative Homans sign  Lymphadenopathy:    He has no cervical adenopathy.  Neurological: He is alert and oriented to person, place, and time. No cranial nerve deficit. Gait normal. Coordination normal.  Skin: Skin is warm and dry. No rash noted.  Psychiatric: Judgment normal.  Patient is anxious and quite loquacious      Assessment & Plan  1. Type 2 diabetes mellitus with other specified complication (HCC) At goal for age - POCT HgB A1C - POCT Glucose (CBG)  2. Essential hypertension Well-controlled  3. Type 2 diabetes mellitus with diabetic neuropathy, without long-term current use of insulin (HCC) At goal for age  36. Factor V Leiden mutation (HCC) Continue Coumadin  5. Hyperlipidemia LDL at goal but triglycerides remain elevated

## 2015-04-09 ENCOUNTER — Telehealth: Payer: Self-pay | Admitting: Family Medicine

## 2015-04-09 DIAGNOSIS — D6851 Activated protein C resistance: Secondary | ICD-10-CM

## 2015-04-09 NOTE — Telephone Encounter (Signed)
Pt needs refill on Warfarin to be sent to [facility].

## 2015-04-12 ENCOUNTER — Telehealth: Payer: Self-pay | Admitting: Emergency Medicine

## 2015-04-12 MED ORDER — WARFARIN SODIUM 5 MG PO TABS: ORAL_TABLET | ORAL | Status: DC

## 2015-04-12 NOTE — Telephone Encounter (Signed)
Script sen to [facility] for Warfarin

## 2015-04-25 ENCOUNTER — Ambulatory Visit (INDEPENDENT_AMBULATORY_CARE_PROVIDER_SITE_OTHER): Payer: Commercial Managed Care - HMO

## 2015-04-25 DIAGNOSIS — D6851 Activated protein C resistance: Secondary | ICD-10-CM

## 2015-04-25 LAB — PROTIME-INR: INR: 1.2 — AB (ref 0.9–1.1)

## 2015-04-25 LAB — POCT INR: INR: 14.8

## 2015-04-25 NOTE — Patient Instructions (Signed)
Patient stopped his coumadin per dentist instructions in order to have a dental surgery and his PT/INR is 1.2 today. Patient counseled on risks.

## 2015-04-29 ENCOUNTER — Other Ambulatory Visit: Payer: Self-pay | Admitting: Family Medicine

## 2015-04-30 ENCOUNTER — Telehealth: Payer: Self-pay | Admitting: Family Medicine

## 2015-04-30 NOTE — Telephone Encounter (Signed)
Patient is requesting a refill on his metformin to be faxed to [facility]. Please call patient once complete.

## 2015-05-01 ENCOUNTER — Other Ambulatory Visit: Payer: Self-pay | Admitting: Family Medicine

## 2015-05-01 NOTE — Telephone Encounter (Signed)
Patient informed that metformin has been sent to [facility]. He is now stating that he has no refills on Glipizide and is requesting that you send it to [facility] as well

## 2015-05-22 ENCOUNTER — Telehealth: Payer: Self-pay | Admitting: Family Medicine

## 2015-05-22 DIAGNOSIS — I10 Essential (primary) hypertension: Secondary | ICD-10-CM

## 2015-05-22 NOTE — Telephone Encounter (Signed)
Pt needs refill on Atenolol [facility].

## 2015-05-23 MED ORDER — ATENOLOL 50 MG PO TABS: 50.0000 mg | ORAL_TABLET | Freq: Two times a day (BID) | ORAL | Status: DC

## 2015-05-23 NOTE — Telephone Encounter (Signed)
LVM to inform pt.

## 2015-05-23 NOTE — Telephone Encounter (Signed)
Refill sent to pharmacy please inform pt

## 2015-05-29 ENCOUNTER — Other Ambulatory Visit: Payer: Self-pay | Admitting: Family Medicine

## 2015-05-29 DIAGNOSIS — Z5181 Encounter for therapeutic drug level monitoring: Secondary | ICD-10-CM | POA: Diagnosis not present

## 2015-05-30 LAB — PROTIME-INR
INR: 1.6 — ABNORMAL HIGH (ref 0.8–1.2)
Prothrombin Time: 16.3 s — ABNORMAL HIGH (ref 9.1–12.0)

## 2015-06-19 ENCOUNTER — Ambulatory Visit: Payer: Commercial Managed Care - HMO | Admitting: Family Medicine

## 2015-07-11 ENCOUNTER — Telehealth: Payer: Self-pay | Admitting: Family Medicine

## 2015-07-11 DIAGNOSIS — E785 Hyperlipidemia, unspecified: Secondary | ICD-10-CM

## 2015-07-11 MED ORDER — LISINOPRIL 40 MG PO TABS: 40.0000 mg | ORAL_TABLET | Freq: Every day | ORAL | Status: DC

## 2015-07-11 NOTE — Telephone Encounter (Signed)
Refill has been sent to [facility] 90 day supply

## 2015-07-11 NOTE — Telephone Encounter (Signed)
LVM to inform pt.

## 2015-07-11 NOTE — Telephone Encounter (Signed)
Pt needs refill of Lisinopril to be sent to [facility] mail order. Pt has 5 days left.

## 2015-07-19 ENCOUNTER — Other Ambulatory Visit: Payer: Self-pay | Admitting: Emergency Medicine

## 2015-07-19 DIAGNOSIS — I10 Essential (primary) hypertension: Secondary | ICD-10-CM

## 2015-07-30 ENCOUNTER — Encounter: Payer: Self-pay | Admitting: Family Medicine

## 2015-07-30 ENCOUNTER — Ambulatory Visit (INDEPENDENT_AMBULATORY_CARE_PROVIDER_SITE_OTHER): Payer: Commercial Managed Care - HMO | Admitting: Family Medicine

## 2015-07-30 ENCOUNTER — Other Ambulatory Visit: Payer: Self-pay | Admitting: Family Medicine

## 2015-07-30 VITALS — BP 122/64 | HR 71 | Temp 98.3°F | Resp 16 | Wt 215.0 lb

## 2015-07-30 DIAGNOSIS — Z5181 Encounter for therapeutic drug level monitoring: Secondary | ICD-10-CM

## 2015-07-30 DIAGNOSIS — E1121 Type 2 diabetes mellitus with diabetic nephropathy: Secondary | ICD-10-CM | POA: Diagnosis not present

## 2015-07-30 DIAGNOSIS — M129 Arthropathy, unspecified: Secondary | ICD-10-CM

## 2015-07-30 DIAGNOSIS — I10 Essential (primary) hypertension: Secondary | ICD-10-CM | POA: Diagnosis not present

## 2015-07-30 DIAGNOSIS — F6381 Intermittent explosive disorder: Secondary | ICD-10-CM | POA: Insufficient documentation

## 2015-07-30 DIAGNOSIS — F901 Attention-deficit hyperactivity disorder, predominantly hyperactive type: Secondary | ICD-10-CM | POA: Diagnosis not present

## 2015-07-30 DIAGNOSIS — E1140 Type 2 diabetes mellitus with diabetic neuropathy, unspecified: Secondary | ICD-10-CM | POA: Diagnosis not present

## 2015-07-30 DIAGNOSIS — Z125 Encounter for screening for malignant neoplasm of prostate: Secondary | ICD-10-CM | POA: Diagnosis not present

## 2015-07-30 DIAGNOSIS — E785 Hyperlipidemia, unspecified: Secondary | ICD-10-CM | POA: Diagnosis not present

## 2015-07-30 DIAGNOSIS — D6851 Activated protein C resistance: Secondary | ICD-10-CM | POA: Diagnosis not present

## 2015-07-30 DIAGNOSIS — Z7901 Long term (current) use of anticoagulants: Secondary | ICD-10-CM | POA: Insufficient documentation

## 2015-07-30 DIAGNOSIS — F909 Attention-deficit hyperactivity disorder, unspecified type: Secondary | ICD-10-CM

## 2015-07-30 DIAGNOSIS — M170 Bilateral primary osteoarthritis of knee: Secondary | ICD-10-CM | POA: Insufficient documentation

## 2015-07-30 HISTORY — DX: Encounter for therapeutic drug level monitoring: Z51.81

## 2015-07-30 HISTORY — DX: Intermittent explosive disorder: F63.81

## 2015-07-30 HISTORY — DX: Attention-deficit hyperactivity disorder, unspecified type: F90.9

## 2015-07-30 HISTORY — DX: Long term (current) use of anticoagulants: Z79.01

## 2015-07-30 NOTE — Assessment & Plan Note (Signed)
Check lipids 

## 2015-07-30 NOTE — Progress Notes (Signed)
BP 122/64 mmHg  Pulse 71  Temp(Src) 98.3 F (36.8 C) (Oral)  Resp 16  Wt 215 lb (97.523 kg)  SpO2 94%   Subjective:    Patient ID: Drew Morgan, male    DOB: 09/28/1940, 75 y.o.   MRN: [id number]  HPI: Drew Morgan is a 75 y.o. male  Chief Complaint  Patient presents with  . Follow-up  . Medication Refill   Patient is here for follow-up, but this is our first meeting; his usual provider is out of the office for an extended period of time He needs two knee replacements, lots of joint issues; two aleve will work for him  He takes warfarin for Factor V leiden; he has diabetes and high cholesterol; we went over his medicines and previous labs  He has depression and intermittent explosive disorder; says the dark cloud has lifted now that the [place] has got him on 2 of the 150 mg venlafaxine daily; trouble adjusting to all the political correctness, the liberals, then said people were trying to kill the Arabs; I immediately asked about that; he says he and others he knows would only to defend the country in a war; he has interviewed several Islamic terrorits; that has been bred in him through [employer] over the years; that drive is there to defend the country; the [place] has been working with him for years; they are going to work on him, he went into a state, a crying spell and got really mad a few weeks ago; No, I'm not suicidal, I don't want to just hurt nobody; that's just me, you're not going to destroy my happiness and my grandkids' way of life; we're not going to have [place] law in [place]; I'm very radical about it; I asked him to clarify this and he says he is working with a Paramedic through the [place], he [facility] the [place] [facility]; he does not believe he poses a threat to himself or others; some things politically work me up he says; "the rage is just unreal"; you see me getting worked up right now; again, he does not think he is a threat to himself or others, and is working through  the feelings with the [place] psychiatrist; he has intermittent explosive disorder; grandkids call him a racist; he lived through the '60s and it was terrible; "I'm proud to be a redneck"; "I'm not suicidal but I have nothing to live for"  He has ADHD, lots of different projects, trouble finishing projects   Depression screen [facility] 2/9 08/31/2015 07/30/2015 07/30/2015 02/12/2015 10/11/2014  Decreased Interest 0 3 0 0 0  Down, Depressed, Hopeless 0 0  PHQ - 2 Score 0 0  Altered sleeping - 3 - - -  Tired, decreased energy - 3 - - -  Change in appetite - 1 - - -  Feeling bad or failure about yourself  - 3 - - -  Trouble concentrating - 3 - - -  Moving slowly or fidgety/restless - 1 - - -  Suicidal thoughts - 0 - - -  PHQ-9 Score - 20 - - -  Difficult doing work/chores - Very difficult - - -   Relevant past medical, surgical, family and social history reviewed Past Medical History  Diagnosis Date  . Depression   . Diabetes mellitus without complication (HCC)   . Hyperlipidemia   . Hypertension   . Insomnia   . Testicular dysfunction   .  Intermittent explosive disorder 07/30/2015  . ADHD (attention deficit hyperactivity disorder) 07/30/2015  . Anticoagulation goal of INR 2 to 3 07/30/2015  . Arthritis     shoulder, both knees, hip, back  . Coronary artery disease   . Factor V Leiden (HCC)   . OSA (obstructive sleep apnea)    Past Surgical History  Procedure Laterality Date  . Coronary angioplasty with stent placement  1995  . Hemorrhoid surgery    . Back surgery    . Uvuloplasty     Family History  Problem Relation Age of Onset  . Anuerysm Father   . Depression Mother   . Depression Sister     taking venlafaxine  . Heart disease Neg Hx    Social History  Substance Use Topics  . Smoking status: Former Games developer  . Smokeless tobacco: None  . Alcohol Use: No   Interim medical history since last visit reviewed. Allergies and medications reviewed  Review of Systems Per HPI  unless specifically indicated above     Objective:    BP 122/64 mmHg  Pulse 71  Temp(Src) 98.3 F (36.8 C) (Oral)  Resp 16  Wt 215 lb (97.523 kg)  SpO2 94%  Wt Readings from Last 3 Encounters:  08/31/15 216 lb (97.977 kg)  07/30/15 215 lb (97.523 kg)  02/12/15 219 lb 8 oz (99.565 kg)    Physical Exam  Constitutional: He appears well-developed and well-nourished. No distress.  HENT:  Head: Normocephalic and atraumatic.  Eyes: EOM are normal. No scleral icterus.  Neck: No thyromegaly present.  Cardiovascular: Normal rate and regular rhythm.   Pulmonary/Chest: Effort normal and breath sounds normal.  Abdominal: Soft. Bowel sounds are normal. He exhibits no distension.  Musculoskeletal: He exhibits no edema.  Neurological: Coordination normal.  Skin: Skin is warm and dry. No pallor.  Psychiatric: His behavior is normal. Judgment and thought content normal. His mood appears not anxious. His affect is not angry, not blunt, not labile and not inappropriate. His speech is not rapid and/or pressured. He is not agitated, not aggressive, not hyperactive and not combative. He does not exhibit a depressed mood. He expresses no homicidal and no suicidal ideation.  Good eye contact with examiner; no impulsive; rational, able to describe and explain his feelings, reactions to things very calmly   Diabetic Foot Form - Detailed   Diabetic Foot Exam - detailed  Diabetic Foot exam was performed with the following findings:  Yes 07/30/2015  2:42 PM  Visual Foot Exam completed.:  Yes  Are the toenails long?:  No  Are the toenails thick?:  No  Are the toenails ingrown?:  No  Normal Range of Motion:  Yes    Pulse Foot Exam completed.:  Yes  Right Dorsalis Pedis:  Present Left Dorsalis Pedis:  Present  Sensory Foot Exam Completed.:  Yes  Semmes-Weinstein Monofilament Test  R Site 1-Great Toe:  Pos L Site 1-Great Toe:  Pos  R Site 4:  Pos L Site 4:  Pos  R Site 5:  Pos L Site 5:  Pos      Comments:  Thick yellowish nail 4th toe right foot         Assessment & Plan:   Problem List Items Addressed This Visit      Cardiovascular and Mediastinum   Essential hypertension    Well-controlled        Endocrine   Type 2 diabetes mellitus with diabetic neuropathy (HCC)    Check A1c today  Musculoskeletal and Integument   Arthritis of both knees    Patient is comfortable using just NSAIDs at this time; no evidence to suggest bleeding on NSAID plus warfarin        Genitourinary   Diabetic nephropathy associated with type 2 diabetes mellitus (HCC)    Check A1c, foot exam by MD today      Relevant Orders   Hemoglobin A1c (Completed)     Hematopoietic and Hemostatic   Factor V Leiden mutation (HCC)    Check INR; goal INR is 2-3; continue coumadin      Relevant Orders   INR/PT (Completed)     Other   Medication monitoring encounter   Relevant Orders   Comprehensive metabolic panel (Completed)   CBC with Differential/Platelet (Completed)   Microalbumin / creatinine urine ratio (Completed)   Intermittent explosive disorder - Primary    [facility]; he has been evaluated for this and under treatment for years he says; this was my first time meeting patient, so I spent significant time asking him about this; I do not believe he currently poses a threat to himself or others, and he agrees [facility] [place] with [facility]      Hyperlipidemia    Check lipids      Relevant Orders   Lipid Panel w/o Chol/HDL Ratio (Completed)   Anticoagulation goal of INR 2 to 3   Relevant Orders   INR/PT (Completed)   ADHD (attention deficit hyperactivity disorder)    Other Visit Diagnoses    Prostate cancer screening        Relevant Orders    PSA (Completed)       Follow up plan: Return in about 4 weeks (around 08/27/2015) for complete physical, 40 minutes.  An after-visit summary was printed and given to the  patient at check-out.  Please see the patient instructions which may contain other information and recommendations beyond what is mentioned above in the assessment and plan.   Orders Placed This Encounter  Procedures  . Hemoglobin A1c  . Comprehensive metabolic panel  . Lipid Panel w/o Chol/HDL Ratio  . CBC with Differential/Platelet  . Microalbumin / creatinine urine ratio  . PSA  . INR/PT

## 2015-07-30 NOTE — Telephone Encounter (Signed)
Was just seen today and forgot to mention that he is needing refill on Amlodipine. Please send to mail order pharmacy

## 2015-07-30 NOTE — Assessment & Plan Note (Signed)
Check A1c, foot exam by MD today 

## 2015-07-30 NOTE — Assessment & Plan Note (Addendum)
Check INR; goal INR is 2-3; continue coumadin

## 2015-07-30 NOTE — Assessment & Plan Note (Signed)
Well controlled 

## 2015-07-30 NOTE — Patient Instructions (Signed)
Please have fasting labs done in the next few days Return for a 40 minute complete physical in the next month

## 2015-07-30 NOTE — Telephone Encounter (Signed)
Amlodipine is not in his med list from today; please verify med list and I'll be glad to send; thanks

## 2015-07-31 MED ORDER — AMLODIPINE BESYLATE 5 MG PO TABS: 5.0000 mg | ORAL_TABLET | Freq: Every day | ORAL | Status: DC

## 2015-07-31 NOTE — Telephone Encounter (Signed)
Pt forgot this med at home and called back today Amlodipine 5mg  1 qd

## 2015-08-03 DIAGNOSIS — E1121 Type 2 diabetes mellitus with diabetic nephropathy: Secondary | ICD-10-CM | POA: Diagnosis not present

## 2015-08-03 DIAGNOSIS — Z5181 Encounter for therapeutic drug level monitoring: Secondary | ICD-10-CM | POA: Diagnosis not present

## 2015-08-03 DIAGNOSIS — Z7901 Long term (current) use of anticoagulants: Secondary | ICD-10-CM | POA: Diagnosis not present

## 2015-08-03 DIAGNOSIS — D6851 Activated protein C resistance: Secondary | ICD-10-CM | POA: Diagnosis not present

## 2015-08-03 DIAGNOSIS — Z125 Encounter for screening for malignant neoplasm of prostate: Secondary | ICD-10-CM | POA: Diagnosis not present

## 2015-08-03 DIAGNOSIS — E785 Hyperlipidemia, unspecified: Secondary | ICD-10-CM | POA: Diagnosis not present

## 2015-08-04 ENCOUNTER — Telehealth: Payer: Self-pay | Admitting: Family Medicine

## 2015-08-04 LAB — COMPREHENSIVE METABOLIC PANEL WITH GFR
ALT: 29 IU/L (ref 0–44)
AST: 26 IU/L (ref 0–40)
Albumin/Globulin Ratio: 1.5 (ref 1.2–2.2)
Albumin: 4.3 g/dL (ref 3.5–4.8)
Alkaline Phosphatase: 63 IU/L (ref 39–117)
BUN/Creatinine Ratio: 21 (ref 10–24)
BUN: 22 mg/dL (ref 8–27)
Bilirubin Total: 0.9 mg/dL (ref 0.0–1.2)
CO2: 20 mmol/L (ref 18–29)
Calcium: 9.2 mg/dL (ref 8.6–10.2)
Chloride: 99 mmol/L (ref 96–106)
Creatinine, Ser: 1.05 mg/dL (ref 0.76–1.27)
GFR calc Af Amer: 82 mL/min/1.73
GFR calc non Af Amer: 71 mL/min/1.73
Globulin, Total: 2.8 g/dL (ref 1.5–4.5)
Glucose: 166 mg/dL — ABNORMAL HIGH (ref 65–99)
Potassium: 5.1 mmol/L (ref 3.5–5.2)
Sodium: 139 mmol/L (ref 134–144)
Total Protein: 7.1 g/dL (ref 6.0–8.5)

## 2015-08-04 LAB — CBC WITH DIFFERENTIAL/PLATELET
BASOS: 1 %
Basophils Absolute: 0.1 10*3/uL (ref 0.0–0.2)
EOS (ABSOLUTE): 0.3 10*3/uL (ref 0.0–0.4)
EOS: 3 %
HEMATOCRIT: 44 % (ref 37.5–51.0)
HEMOGLOBIN: 14.6 g/dL (ref 12.6–17.7)
IMMATURE GRANULOCYTES: 0 %
Immature Grans (Abs): 0 10*3/uL (ref 0.0–0.1)
LYMPHS ABS: 3.9 10*3/uL — AB (ref 0.7–3.1)
Lymphs: 40 %
MCH: 29.6 pg (ref 26.6–33.0)
MCHC: 33.2 g/dL (ref 31.5–35.7)
MCV: 89 fL (ref 79–97)
MONOCYTES: 9 %
Monocytes Absolute: 0.9 10*3/uL (ref 0.1–0.9)
Neutrophils Absolute: 4.7 10*3/uL (ref 1.4–7.0)
Neutrophils: 47 %
Platelets: 252 10*3/uL (ref 150–379)
RBC: 4.94 x10E6/uL (ref 4.14–5.80)
RDW: 14.2 % (ref 12.3–15.4)
WBC: 9.9 10*3/uL (ref 3.4–10.8)

## 2015-08-04 LAB — LIPID PANEL W/O CHOL/HDL RATIO
Cholesterol, Total: 245 mg/dL — ABNORMAL HIGH (ref 100–199)
HDL: 38 mg/dL — ABNORMAL LOW
Triglycerides: 679 mg/dL (ref 0–149)

## 2015-08-04 LAB — HEMOGLOBIN A1C
Est. average glucose Bld gHb Est-mCnc: 180 mg/dL
Hgb A1c MFr Bld: 7.9 % — ABNORMAL HIGH (ref 4.8–5.6)

## 2015-08-04 LAB — PROTIME-INR
INR: 2.8 — ABNORMAL HIGH (ref 0.8–1.2)
Prothrombin Time: 28.7 s — ABNORMAL HIGH (ref 9.1–12.0)

## 2015-08-04 LAB — MICROALBUMIN / CREATININE URINE RATIO
Creatinine, Urine: 162.7 mg/dL
MICROALB/CREAT RATIO: 24.4 mg/g{creat} (ref 0.0–30.0)
MICROALBUM., U, RANDOM: 39.7 ug/mL

## 2015-08-04 LAB — PSA: Prostate Specific Ag, Serum: 1.8 ng/mL (ref 0.0–4.0)

## 2015-08-04 NOTE — Telephone Encounter (Signed)
INR is 2.8 TG are high Call pt with results

## 2015-08-04 NOTE — Telephone Encounter (Signed)
Labs are back, I called patient; reached voicemail; will try to reach him with results tomorrow

## 2015-08-06 NOTE — Telephone Encounter (Signed)
I left message for patient, INR is fine, continue same regimen, recheck in one month A1c has gone up so we'll adjust medicine when we talk Kidney function is good and liver enzymes are all normal We'll talk soon

## 2015-08-07 NOTE — Telephone Encounter (Signed)
Pt called back about results please call on cell

## 2015-08-08 ENCOUNTER — Other Ambulatory Visit: Payer: Self-pay | Admitting: Family Medicine

## 2015-08-08 DIAGNOSIS — E785 Hyperlipidemia, unspecified: Secondary | ICD-10-CM

## 2015-08-08 DIAGNOSIS — Z5181 Encounter for therapeutic drug level monitoring: Secondary | ICD-10-CM
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
## 2015-08-15 NOTE — Assessment & Plan Note (Signed)
Check SGPT in 6 weeks 

## 2015-08-15 NOTE — Assessment & Plan Note (Signed)
Check lipids in 6 weeks

## 2015-08-15 NOTE — Telephone Encounter (Signed)
Please call patient and see if he would be willing to increase his atorvastatin from 20 mg to 40 mg I'd like to bring his cholesterol down more and know we can do better If he's okay with that, I'll send new Rx (back to me) Try to limit or avoid bologna and hot dogs and bacon and sausage and cheese and hamburgers (sorry if I'm taking away all of his favorite foods) Let's recheck his cholesterol and liver enzyme in 6 weeks

## 2015-08-17 ENCOUNTER — Other Ambulatory Visit: Payer: Self-pay

## 2015-08-17 DIAGNOSIS — E785 Hyperlipidemia, unspecified: Secondary | ICD-10-CM

## 2015-08-17 MED ORDER — ATORVASTATIN CALCIUM 40 MG PO TABS: 40.0000 mg | ORAL_TABLET | Freq: Every day | ORAL | Status: DC

## 2015-08-17 NOTE — Telephone Encounter (Signed)
Pt states may send in higher dose of 40mg  he will try it may give him muscle pain?

## 2015-08-17 NOTE — Telephone Encounter (Signed)
We'll try the 40 mg

## 2015-08-17 NOTE — Telephone Encounter (Signed)
Left voicemail to call us back

## 2015-08-31 ENCOUNTER — Encounter: Payer: Self-pay | Admitting: Family Medicine

## 2015-08-31 ENCOUNTER — Ambulatory Visit (INDEPENDENT_AMBULATORY_CARE_PROVIDER_SITE_OTHER): Payer: Commercial Managed Care - HMO | Admitting: Family Medicine

## 2015-08-31 VITALS — BP 122/64 | HR 80 | Temp 98.0°F | Resp 16 | Wt 216.0 lb

## 2015-08-31 DIAGNOSIS — G4733 Obstructive sleep apnea (adult) (pediatric): Secondary | ICD-10-CM | POA: Insufficient documentation

## 2015-08-31 DIAGNOSIS — I2510 Atherosclerotic heart disease of native coronary artery without angina pectoris: Secondary | ICD-10-CM | POA: Insufficient documentation

## 2015-08-31 DIAGNOSIS — Z0000 Encounter for general adult medical examination without abnormal findings: Secondary | ICD-10-CM | POA: Diagnosis not present

## 2015-08-31 DIAGNOSIS — I10 Essential (primary) hypertension: Secondary | ICD-10-CM | POA: Diagnosis not present

## 2015-08-31 DIAGNOSIS — D6851 Activated protein C resistance: Secondary | ICD-10-CM | POA: Insufficient documentation

## 2015-08-31 MED ORDER — ICOSAPENT ETHYL 1 G PO CAPS: 2.0000 | ORAL_CAPSULE | Freq: Two times a day (BID) | ORAL | Status: DC

## 2015-08-31 MED ORDER — GLUCOSE BLOOD VI STRP: ORAL_STRIP | Status: DC

## 2015-08-31 MED ORDER — ATENOLOL 50 MG PO TABS: 50.0000 mg | ORAL_TABLET | Freq: Two times a day (BID) | ORAL | Status: DC

## 2015-08-31 NOTE — Progress Notes (Signed)
Patient: Drew Morgan, Male    DOB: 1941/01/11, 75 y.o.   MRN: [id number]  Visit Date: 09/09/2015  Today's Provider: Baruch Gouty, MD   Chief Complaint  Patient presents with  . Annual Exam    Subjective:   Drew Morgan is a 75 y.o. male who presents today for his Subsequent Annual Wellness Visit.  Caregiver input:  n/a  USPSTF grade A and B recommendations Alcohol: no Depression: Depression screen [facility] 2/9 08/31/2015 07/30/2015 07/30/2015 02/12/2015 10/11/2014  Decreased Interest 0 3 0 0 0  Down, Depressed, Hopeless 1 3 1  0 0  PHQ - 2 Score 1 6 1  0 0  Altered sleeping - 3 - - -  Tired, decreased energy - 3 - - -  Change in appetite - 1 - - -  Feeling bad or failure about yourself  - 3 - - -  Trouble concentrating - 3 - - -  Moving slowly or fidgety/restless - 1 - - -  Suicidal thoughts - 0 - - -  PHQ-9 Score - 20 - - -  Difficult doing work/chores - Very difficult - - -    Hypertension: controlled Obesity: no Tobacco use: former smoker, smoked on and off, chewed on and off HIV, hep B, hep C: had those done Lipids: start vascepa Glucose: test at home Colorectal cancer: through the [place], last year, one polyp Breast cancer: no lumps Had test on testicles at the [place], everything was fine Lung cancer: has not smoked total of 30 years Osteoporosis: no fam hx AAA: normal at age 75 Aspirin: no, on warfarin; takes two aleve and gets by the whole day; knows he is not supposed to take it;  Diet: likes to eat Exercise: tries to stay active Skin cancer: had two skin cancers removed, [facility]  He clarified some of his mental health issues that we discussed last time; he has no ill wishes or plans to hurt anyone; 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 Depression   . Diabetes mellitus without complication (HCC)   . Hyperlipidemia   . Hypertension   . Insomnia   . Testicular dysfunction   . Intermittent explosive disorder 07/30/2015  . ADHD (attention deficit hyperactivity disorder) 07/30/2015  . Anticoagulation goal of INR 2 to 3 07/30/2015  . Arthritis     shoulder, both knees, hip, back  . Coronary artery disease   . Factor V Leiden (HCC)   . OSA (obstructive sleep apnea)     Past Surgical History  Procedure Laterality Date  . Coronary angioplasty with stent placement  1995  . Hemorrhoid surgery    . Back surgery    . Uvuloplasty      Family History  Problem Relation Age of Onset  . Anuerysm Father   . Depression Mother   . Depression Sister     taking venlafaxine  . Heart disease Neg Hx     Social History   Social History  . Marital Status: Married    Spouse Name: N/A  . Number of  Children: N/A  . Years of Education: N/A   Occupational History  . Not on file.   Social History Main Topics  . Smoking status: Former Games developer  . Smokeless tobacco: Not on file  . Alcohol Use: No  . Drug Use: No  . Sexual Activity: No   Other Topics Concern  . Not on file   Social History Narrative    Outpatient Encounter Prescriptions as of 08/31/2015  Medication Sig  . amLODipine (NORVASC) 5 MG tablet Take 1 tablet (5 mg total) by mouth daily.  Marland Kitchen atenolol (TENORMIN) 50 MG tablet Take 1 tablet (50 mg total) by mouth 2 (two) times daily.  Marland Kitchen atorvastatin (LIPITOR) 40 MG tablet Take 1 tablet (40 mg total) by mouth at bedtime.  Marland Kitchen glipiZIDE (GLUCOTROL XL) 10 MG 24 hr tablet TAKE 1 TABLET (10 MG TOTAL) BY MOUTH 2 (TWO) TIMES DAILY AT 10 AM AND 5 PM.  . glucose blood test strip Test three times a day; Dx E11.65  . Icosapent Ethyl  (VASCEPA) 1 g CAPS Take 2 capsules by mouth 2 (two) times daily.  . insulin detemir (LEVEMIR) 100 unit/ml SOLN Inject 0.53 mLs (53 Units total) into the skin at bedtime.  Marland Kitchen lisinopril (PRINIVIL,ZESTRIL) 40 MG tablet Take 1 tablet (40 mg total) by mouth daily.  . metFORMIN (GLUCOPHAGE) 1000 MG tablet TAKE 1 TABLET TWICE DAILY  . warfarin (COUMADIN) 5 MG tablet TAKE 1 AND 1/2 TABLETS EVERY DAY  . [DISCONTINUED] atenolol (TENORMIN) 50 MG tablet Take 1 tablet (50 mg total) by mouth 2 (two) times daily. (Patient taking differently: Take 50 mg by mouth 3 (three) times daily. )   No facility-administered encounter medications on file as of 08/31/2015.    Functional Ability / Safety Screening 1.  Was the timed Get Up and Go test longer than 30 seconds?  no 2.  Does the patient need help with the phone, transportation, shopping,      preparing meals, housework, laundry, medications, or managing money?  no 3.  Does the patient's home have:  loose throw rugs in the hallway?   yes      Grab bars in the bathroom? no, he will put the      Handrails on the stairs?   no      Poor lighting?   no 4.  Has the patient noticed any hearing difficulties?   no Most disorganized  Fall Risk Assessment See under rooming  Depression Screen See under rooming Depression screen [facility] 2/9 08/31/2015 07/30/2015 07/30/2015 02/12/2015 10/11/2014  Decreased Interest 0 3 0 0 0  Down, Depressed, Hopeless 0 0  PHQ - 2 Score 0 0  Altered sleeping - 3 - - -  Tired, decreased energy - 3 - - -  Change in appetite - 1 - - -  Feeling bad or failure about yourself  - 3 - - -  Trouble concentrating - 3 - - -  Moving slowly or fidgety/restless - 1 - - -  Suicidal thoughts - 0 - - -  PHQ-9 Score - 20 - - -  Difficult doing work/chores - Very difficult - - -    Advanced Directives Does patient have a HCPOA?    no If yes, name and contact information: wife, may change that, Clydie Braun L. Domine Does patient have a living  will or MOST form?  no  Objective:   Vitals: BP 122/64 mmHg  Pulse 80  Temp(Src) 98 F (36.7  C) (Oral)  Resp 16  Wt 216 lb (97.977 kg)  SpO2 96% Body mass index is 28.5 kg/(m^2). No exam data present He wants to lose 15 pounds  Physical Exam  Constitutional: He appears well-developed and well-nourished. No distress.  Cardiovascular: Normal rate.   Pulmonary/Chest: Effort normal.  Psychiatric: He has a normal mood and affect. His mood appears not anxious. His affect is not inappropriate. His speech is not rapid and/or pressured. He is not agitated, not aggressive, not hyperactive and not combative. Thought content is not paranoid. Cognition and memory are not impaired. He does not express impulsivity or inappropriate judgment. He does not exhibit a depressed mood. He expresses no homicidal and no suicidal ideation.  Pleasant and cooperative; opened our visit with a word of prayer; good eye contact with examiner   Mood/affect:  euthymic Appearance:  Casually dressed  Cognitive Testing - 6-CIT  Correct? Score   What year is it? yes 0 Yes = 0    No = 4  What month is it? yes 0 Yes = 0    No = 3  Remember:     Floyde ParkinsJohn Smith, [address] [place], [place]     What time is it? yes 0 Yes = 0    No = 3  Count backwards from 20 to 1 yes 0 Correct = 0    1 error = 2   More than 1 error = 4  Say the months of the year in reverse. yes 0 Correct = 0    1 error = 2   More than 1 error = 4  What address did I ask you to remember? yes 0 Correct = 0  1 error = 2    2 error = 4    3 error = 6    4 error = 8    All wrong = 10       TOTAL SCORE  0/28   Interpretation:  Normal  Normal (0-7) Abnormal (8-28)    Assessment & Plan:     Annual Wellness Visit  Reviewed patient's Family Medical History Reviewed and updated list of patient's medical providers Assessment of cognitive impairment was done Assessed patient's functional ability Established a written schedule for health screening  services Health Risk Assessent Completed and Reviewed  Exercise Activities and Dietary recommendations Goals    None    he wants to lose 15 pounds   There is no immunization history on file for this patient.  Health Maintenance  Topic Date Due  . ZOSTAVAX  11/13/2002  . INFLUENZA VACCINE  10/09/2015  . HEMOGLOBIN A1C  02/03/2016  . OPHTHALMOLOGY EXAM  03/10/2016  . FOOT EXAM  07/29/2016  . TETANUS/TDAP  02/05/2020  . COLONOSCOPY  08/31/2024  . PNA vac Low Risk Adult  Completed     Discussed health benefits of physical activity, and encouraged him to engage in regular exercise appropriate for his age and condition.   Meds ordered this encounter  Medications  . Icosapent Ethyl (VASCEPA) 1 g CAPS    Sig: Take 2 capsules by mouth 2 (two) times daily.    Dispense:  360 capsule    Refill:  3  . glucose blood test strip    Sig: Test three times a day; Dx E11.65    Dispense:  100 each    Refill:  11  . atenolol (TENORMIN) 50 MG tablet    Sig: Take 1 tablet (50 mg total) by mouth 2 (two)  times daily.    Dispense:  180 tablet    Refill:  1    Current outpatient prescriptions:  .  amLODipine (NORVASC) 5 MG tablet, Take 1 tablet (5 mg total) by mouth daily., Disp: 90 tablet, Rfl: 3 .  atenolol (TENORMIN) 50 MG tablet, Take 1 tablet (50 mg total) by mouth 2 (two) times daily., Disp: 180 tablet, Rfl: 1 .  atorvastatin (LIPITOR) 40 MG tablet, Take 1 tablet (40 mg total) by mouth at bedtime., Disp: 90 tablet, Rfl: 0 .  glipiZIDE (GLUCOTROL XL) 10 MG 24 hr tablet, TAKE 1 TABLET (10 MG TOTAL) BY MOUTH 2 (TWO) TIMES DAILY AT 10 AM AND 5 PM., Disp: 180 tablet, Rfl: 1 .  glucose blood test strip, Test three times a day; Dx E11.65, Disp: 100 each, Rfl: 11 .  Icosapent Ethyl (VASCEPA) 1 g CAPS, Take 2 capsules by mouth 2 (two) times daily., Disp: 360 capsule, Rfl: 3 .  insulin detemir (LEVEMIR) 100 unit/ml SOLN, Inject 0.53 mLs (53 Units total) into the skin at bedtime., Disp: 1 vial, Rfl:   .  lisinopril (PRINIVIL,ZESTRIL) 40 MG tablet, Take 1 tablet (40 mg total) by mouth daily., Disp: 90 tablet, Rfl: 0 .  metFORMIN (GLUCOPHAGE) 1000 MG tablet, TAKE 1 TABLET TWICE DAILY, Disp: 180 tablet, Rfl: 1 .  warfarin (COUMADIN) 5 MG tablet, TAKE 1 AND 1/2 TABLETS EVERY DAY, Disp: 135 tablet, Rfl: 1 Medications Discontinued During This Encounter  Medication Reason  . atenolol (TENORMIN) 50 MG tablet Reorder    Next Medicare Wellness Visit in 12+ months

## 2015-09-13 ENCOUNTER — Other Ambulatory Visit: Payer: Self-pay | Admitting: Family Medicine

## 2015-09-14 ENCOUNTER — Telehealth: Payer: Self-pay | Admitting: Family Medicine

## 2015-09-14 NOTE — Telephone Encounter (Signed)
Patient is not able to afford the fenofibrate, it will cost him [phone] because it is above the tier 2. He is asking for a generic that is tier 1 or 2.  Also, asking when should he schedule another visit with you?

## 2015-09-17 ENCOUNTER — Other Ambulatory Visit: Payer: Self-pay | Admitting: Family Medicine

## 2015-09-17 DIAGNOSIS — E785 Hyperlipidemia, unspecified: Secondary | ICD-10-CM

## 2015-09-17 MED ORDER — LISINOPRIL 40 MG PO TABS: 40.0000 mg | ORAL_TABLET | Freq: Every day | ORAL | Status: DC

## 2015-09-17 NOTE — Progress Notes (Signed)
rx request for lisinopril noted under "review reports" I reviewed last creatinine and K+; Rx approved and sent

## 2015-09-19 NOTE — Telephone Encounter (Signed)
I don't want patient to take a fenofibrate; that is the drug that we stopped Instead of the fenofibrate, I want him to try Vascepa; we have savings cards for that If his insurance/savings card won't work with Tillman SersVascepa and it's too expensive, we can try Lovaza I'd like to see him back in September for next A1c and fasting labs; thank you

## 2015-09-20 ENCOUNTER — Telehealth: Payer: Self-pay

## 2015-09-20 MED ORDER — OMEGA-3-ACID ETHYL ESTERS 1 G PO CAPS: 2.0000 g | ORAL_CAPSULE | Freq: Two times a day (BID) | ORAL | Status: DC

## 2015-09-20 NOTE — Telephone Encounter (Signed)
Pt states cannot afford vascepa even with savings card

## 2015-09-20 NOTE — Telephone Encounter (Signed)
Vascepa cancelled; we'll do Lovaza instead I sent rx to mail order pharmacy

## 2015-09-22 NOTE — Assessment & Plan Note (Signed)
[facility]; he has been evaluated for this and under treatment for years he says; this was my first time meeting patient, so I spent significant time asking him about this; I do not believe he currently poses a threat to himself or others, and [place] [facility]

## 2015-09-22 NOTE — Assessment & Plan Note (Signed)
Check A1c today.

## 2015-09-22 NOTE — Assessment & Plan Note (Signed)
Patient is comfortable using just NSAIDs at this time; no evidence to suggest bleeding on NSAID plus warfarin

## 2015-10-01 ENCOUNTER — Telehealth: Payer: Self-pay

## 2015-10-01 ENCOUNTER — Telehealth: Payer: Self-pay | Admitting: Family Medicine

## 2015-10-01 NOTE — Telephone Encounter (Signed)
Pt needs refill on prodigy test strips?

## 2015-10-01 NOTE — Telephone Encounter (Signed)
Pt states the lovaza is also to exspensive for him.

## 2015-10-02 NOTE — Telephone Encounter (Signed)
If he cannot afford the Vascepa or the Lovaza, then we'll have him start over-the-counter krill oil, two capsules twice a day

## 2015-10-02 NOTE — Telephone Encounter (Signed)
Hand-written orders to be faxed

## 2015-10-02 NOTE — Telephone Encounter (Signed)
Pt.notified

## 2015-10-07 ENCOUNTER — Other Ambulatory Visit: Payer: Self-pay | Admitting: Family Medicine

## 2015-10-07 DIAGNOSIS — D6851 Activated protein C resistance: Secondary | ICD-10-CM

## 2015-10-17 ENCOUNTER — Telehealth: Payer: Self-pay

## 2015-10-17 DIAGNOSIS — I10 Essential (primary) hypertension: Secondary | ICD-10-CM

## 2015-10-17 MED ORDER — ATENOLOL 50 MG PO TABS: 50.0000 mg | ORAL_TABLET | Freq: Two times a day (BID) | ORAL | 2 refills | Status: DC

## 2015-10-17 MED ORDER — INSULIN PEN NEEDLE 31G X 8 MM MISC: 3 refills | Status: DC

## 2015-10-17 NOTE — Telephone Encounter (Signed)
I updated the med list with the pen needles and sent Rx I'm printing the atenolol so he can take that anywhere

## 2015-10-17 NOTE — Telephone Encounter (Signed)
BD insulin syringe 1ml cc 8mm gage 31 and also states his atenolo is on back order and would like written rx to take to diff pharmacy

## 2015-10-18 ENCOUNTER — Telehealth: Payer: Self-pay | Admitting: Emergency Medicine

## 2015-10-18 MED ORDER — GLUCOSE BLOOD VI STRP: ORAL_STRIP | 11 refills | Status: DC

## 2015-10-18 NOTE — Telephone Encounter (Signed)
I filled all of that out yesterday

## 2015-10-18 NOTE — Telephone Encounter (Signed)
Please send new script for either Accu chek nans meter, smartview test strips, [facility]ix [facility]ets [facility] [phone]

## 2015-11-09 ENCOUNTER — Telehealth: Payer: Self-pay | Admitting: Family Medicine

## 2015-11-09 ENCOUNTER — Other Ambulatory Visit: Payer: Self-pay

## 2015-11-09 DIAGNOSIS — D6851 Activated protein C resistance: Secondary | ICD-10-CM | POA: Diagnosis not present

## 2015-11-09 DIAGNOSIS — Z5181 Encounter for therapeutic drug level monitoring: Secondary | ICD-10-CM | POA: Diagnosis not present

## 2015-11-09 MED ORDER — WARFARIN SODIUM 5 MG PO TABS: ORAL_TABLET | ORAL | 1 refills | Status: DC

## 2015-11-09 NOTE — Telephone Encounter (Signed)
rx sent

## 2015-11-10 ENCOUNTER — Telehealth: Payer: Self-pay | Admitting: Family Medicine

## 2015-11-10 LAB — PROTIME-INR
INR: 1.8 — AB (ref 0.8–1.2)
PROTHROMBIN TIME: 18.1 s — AB (ref 9.1–12.0)

## 2015-11-10 NOTE — Telephone Encounter (Signed)
I called and left message; INR is 1.8, blood a little too thick; take an extra half of a pill today, then back to normal and call us Tuesday with how he's been taking it exactly, if any changes like antibiotics or altered diet or OTCs

## 2015-12-03 ENCOUNTER — Other Ambulatory Visit: Payer: Self-pay | Admitting: Family Medicine

## 2015-12-03 DIAGNOSIS — I10 Essential (primary) hypertension: Secondary | ICD-10-CM

## 2015-12-03 NOTE — Telephone Encounter (Signed)
I just approved a year's supply on Oct 18, 2015 I can't approve any more until next August

## 2015-12-10 MED ORDER — ATENOLOL 50 MG PO TABS: 50.0000 mg | ORAL_TABLET | Freq: Two times a day (BID) | ORAL | 1 refills | Status: DC

## 2015-12-10 MED ORDER — METFORMIN HCL 1000 MG PO TABS: 1000.0000 mg | ORAL_TABLET | Freq: Two times a day (BID) | ORAL | 1 refills | Status: DC

## 2015-12-10 NOTE — Telephone Encounter (Signed)
Patient wanted to give Drew MuirJamie the [facility] [phone].  Patient also stated that he only has 4 days of metformin left and in addition to [facility] is will also need a 30 day supply [facility] [address] because he want get the RX from [facility] in [facility].  Patient is also needing his refill on Atenolol.

## 2015-12-10 NOTE — Telephone Encounter (Signed)
Sorry I think we are confused I called to clarify with patient he needs refill on metformin for 90 day [facility] and then [facility] only [facility] 3 days left.  Also needs [facility] due to mail order being without med for a while (out of stock).

## 2015-12-11 MED ORDER — ATENOLOL 50 MG PO TABS: 50.0000 mg | ORAL_TABLET | Freq: Two times a day (BID) | ORAL | 1 refills | Status: DC

## 2015-12-11 MED ORDER — METFORMIN HCL 1000 MG PO TABS: 1000.0000 mg | ORAL_TABLET | Freq: Two times a day (BID) | ORAL | 1 refills | Status: DC

## 2015-12-11 NOTE — Addendum Note (Signed)
Addended by: LADA, Janit BernMELINDA P on: 12/11/2015 08:56 AM   Modules accepted: Orders

## 2015-12-12 ENCOUNTER — Other Ambulatory Visit: Payer: Self-pay

## 2015-12-12 NOTE — Telephone Encounter (Signed)
Needs 90 day supply 

## 2015-12-12 NOTE — Telephone Encounter (Signed)
I looked at meds; patient is 75 yo, on sulfonylurea plus insulin; I'd like to stop the sulfonylurea I called patient to discuss; left moderate length message; explained that I don't think this medicine is likely to be very effective, plus puts him at risk of low blood sugars, so I'd like to stop it We're a team, so I welcome his thoughts if he'd like to discuss this Monitor sugars and we can adjust insulin

## 2015-12-23 ENCOUNTER — Other Ambulatory Visit: Payer: Self-pay | Admitting: Family Medicine

## 2015-12-24 NOTE — Telephone Encounter (Signed)
Requesting refill on glipizide. Please send to [facility]. Patient have about 6 pills left.

## 2015-12-24 NOTE — Telephone Encounter (Signed)
This is not on pt current med list

## 2015-12-24 NOTE — Telephone Encounter (Signed)
Please see 12/12/15 note He's not supposed to be taking glipizide any more Also, I don't see any recent INR checks for him Are we monitoring or the [facility]? If we are monitoring, then I really ask that we have these checks done at least once a month Thank you

## 2015-12-25 NOTE — Telephone Encounter (Signed)
Left detailed voicemail, and told patient to return call.

## 2016-01-04 ENCOUNTER — Telehealth: Payer: Self-pay

## 2016-01-04 MED ORDER — LINAGLIPTIN 5 MG PO TABS: 5.0000 mg | ORAL_TABLET | Freq: Every day | ORAL | 5 refills | Status: DC

## 2016-01-04 NOTE — Telephone Encounter (Signed)
Patient called states wanted refill on glipizide has been out for 5 days.  But I see that you discontinued.  Please advise how you would like to adjust insulin he states currently taking 40-60units?

## 2016-01-04 NOTE — Telephone Encounter (Signed)
I see he has an appointment on Monday, so please ask him to bring his readings in with him; that will be great to review Let's add Tradjenta 5 mg daily to help his diabetes The glipizide really wouldn't be doing much at this point; once someone is on insulin, especially higher doses, the insulin is like a fire hose and the glipizide is like a little water pistol in terms of bringing down sugars Thank you

## 2016-01-07 ENCOUNTER — Encounter: Payer: Self-pay | Admitting: Family Medicine

## 2016-01-07 ENCOUNTER — Ambulatory Visit (INDEPENDENT_AMBULATORY_CARE_PROVIDER_SITE_OTHER): Payer: Commercial Managed Care - HMO | Admitting: Family Medicine

## 2016-01-07 VITALS — BP 132/68 | HR 92 | Temp 98.0°F | Resp 18 | Ht 73.0 in | Wt 218.2 lb

## 2016-01-07 DIAGNOSIS — E1140 Type 2 diabetes mellitus with diabetic neuropathy, unspecified: Secondary | ICD-10-CM | POA: Diagnosis not present

## 2016-01-07 DIAGNOSIS — I2510 Atherosclerotic heart disease of native coronary artery without angina pectoris: Secondary | ICD-10-CM | POA: Diagnosis not present

## 2016-01-07 DIAGNOSIS — Z5181 Encounter for therapeutic drug level monitoring: Secondary | ICD-10-CM

## 2016-01-07 DIAGNOSIS — IMO0002 Reserved for concepts with insufficient information to code with codable children: Secondary | ICD-10-CM

## 2016-01-07 DIAGNOSIS — Z7901 Long term (current) use of anticoagulants: Secondary | ICD-10-CM

## 2016-01-07 DIAGNOSIS — Z23 Encounter for immunization: Secondary | ICD-10-CM

## 2016-01-07 DIAGNOSIS — G8929 Other chronic pain: Secondary | ICD-10-CM | POA: Diagnosis not present

## 2016-01-07 DIAGNOSIS — E1165 Type 2 diabetes mellitus with hyperglycemia: Secondary | ICD-10-CM

## 2016-01-07 DIAGNOSIS — E782 Mixed hyperlipidemia: Secondary | ICD-10-CM

## 2016-01-07 DIAGNOSIS — D6851 Activated protein C resistance: Secondary | ICD-10-CM | POA: Diagnosis not present

## 2016-01-07 DIAGNOSIS — M25511 Pain in right shoulder: Secondary | ICD-10-CM

## 2016-01-07 DIAGNOSIS — F6381 Intermittent explosive disorder: Secondary | ICD-10-CM | POA: Diagnosis not present

## 2016-01-07 NOTE — Patient Instructions (Signed)
We'll contact you about the INR tomorrow See the [facility] for your IED See if they would try phenytoin, oxcarbazepine, or carbamazepine for IED; other drugs include topiramate and lamotrigine Try to limit saturated fats in your diet (bologna, hot dogs, barbeque, cheeseburgers, hamburgers, steak, bacon, sausage, cheese, etc.) and get more fresh fruits, vegetables, and whole grains Cinnamon may help with sugar regulation

## 2016-01-07 NOTE — Progress Notes (Signed)
BP 132/68 (BP Location: Left Arm, Patient Position: Sitting, Cuff Size: Large)   Pulse 92   Temp 98 F (36.7 C) (Oral)   Resp 18   Ht _0  (1.854 m)   Wt 218 lb 3.2 oz (99 kg)   SpO2 94%   BMI 28.79 kg/m    Subjective:    Patient ID: Drew Morgan, male    DOB: 1940/04/04, 75 y.o.   MRN: [id number]  HPI: Drew Morgan is a 75 y.o. male  Chief Complaint  Patient presents with  . Medication Refill   Patient is here for follow-up Right shoulder pain; electric kind of sensation sometimes; he is right-handed He can throw overhand so he doesn't think it's rotator cuff  INR drawn okay; no bleeding; on chronic coumadin therapy No evidence of clot; does get [facility] some days but nothing new and with significant exertion  Not exercising like he should he says  Limiting sodium in his diet; BP controlled today  Diabetes type 2; some sugars are good; 166 the other day; using insulin; no foot problems  Had a stress test and he'll get [facility] to do that soon; had stent placed; sees Dr. Clayborn Bigness but gets things done through [place] (tests, etc)  Depression screen [facility] 2/9 01/07/2016 08/31/2015 07/30/2015 07/30/2015 02/12/2015  Decreased Interest 0 0 3 0 0  Down, Depressed, Hopeless 0 _1 0  PHQ - 2 Score 0 _2 0  Altered sleeping - - 3 - -  Tired, decreased energy - - 3 - -  Change in appetite - - 1 - -  Feeling bad or failure about yourself  - - 3 - -  Trouble concentrating - - 3 - -  Moving slowly or fidgety/restless - - 1 - -  Suicidal thoughts - - 0 - -  PHQ-9 Score - - 20 - -  Difficult doing work/chores - - Very difficult - -   Relevant past medical, surgical, family and social history reviewed Past Medical History:  Diagnosis Date  . ADHD (attention deficit hyperactivity disorder) 07/30/2015  . Anticoagulation goal of INR 2 to 3 07/30/2015  . Arthritis    shoulder, both knees, hip, back  . Coronary artery disease   . Depression   . Diabetes mellitus without complication  ([place])   . Factor V Leiden ([place])   . Hyperlipidemia   . Hypertension   . Insomnia   . Intermittent explosive disorder 07/30/2015  . OSA (obstructive sleep apnea)   . Testicular dysfunction    Past Surgical History:  Procedure Laterality Date  . BACK SURGERY    . CORONARY ANGIOPLASTY WITH STENT PLACEMENT  1995  . HEMORRHOID SURGERY    . UVULOPLASTY     Family History  Problem Relation Age of Onset  . Anuerysm Father   . Depression Mother   . Depression Sister     taking venlafaxine  . Heart disease Neg Hx    Social History  Substance Use Topics  . Smoking status: Former Smoker    Types: Cigarettes    Quit date: 01/07/1991  . Smokeless tobacco: Former Systems developer    Types: Snuff     Comment: social smoker when he did smoke  . Alcohol use No   Interim medical history since last visit reviewed. Allergies and medications reviewed  Review of Systems Per HPI unless specifically indicated above     Objective:    BP 132/68 (BP Location: Left Arm, Patient Position:  Sitting, Cuff Size: Large)   Pulse 92   Temp 98 F (36.7 C) (Oral)   Resp 18   Ht _0  (1.854 m)   Wt 218 lb 3.2 oz (99 kg)   SpO2 94%   BMI 28.79 kg/m   Wt Readings from Last 3 Encounters:  01/07/16 218 lb 3.2 oz (99 kg)  08/31/15 216 lb (98 kg)  07/30/15 215 lb (97.5 kg)    Physical Exam  Constitutional: He appears well-developed and well-nourished. No distress.  Eyes: No scleral icterus.  Neck: No JVD present.  Cardiovascular: Normal rate.   Pulmonary/Chest: Effort normal. No respiratory distress. He has no wheezes. He has no rales.  Abdominal: Soft. He exhibits no distension.  Musculoskeletal: He exhibits no edema.  Neurological: He is alert.  Skin: Skin is warm. No pallor.  Psychiatric: He has a normal mood and affect. His mood appears not anxious. His affect is not inappropriate. His speech is not rapid and/or pressured. He is not agitated, not aggressive, not hyperactive and not combative. Thought  content is not paranoid. Cognition and memory are not impaired. He does not express impulsivity or inappropriate judgment. He does not exhibit a depressed mood. He expresses no homicidal and no suicidal ideation.  Pleasant and cooperative; opened our visit with a word of prayer; good eye contact with examiner   Diabetic Foot Form - Detailed   Diabetic Foot Exam - detailed Diabetic Foot exam was performed with the following findings:  Yes 01/07/2016  9:05 AM  Visual Foot Exam completed.:  Yes  Are the toenails ingrown?:  No Normal Range of Motion:  Yes Pulse Foot Exam completed.:  Yes  Right Dorsalis Pedis:  Present Left Dorsalis Pedis:  Present  Sensory Foot Exam Completed.:  Yes Swelling:  No Semmes-Weinstein Monofilament Test R Site 1-Great Toe:  Pos L Site 1-Great Toe:  Pos  R Site 4:  Pos L Site 4:  Pos  R Site 5:  Pos L Site 5:  Pos        Results for orders placed or performed in visit on 11/09/15  INR/PT  Result Value Ref Range   INR 1.8 (H) 0.8 - 1.2   Prothrombin Time 18.1 (H) 9.1 - 12.0 sec  INR/PT  Result Value Ref Range   INR 1.4 (H) 0.8 - 1.2   Prothrombin Time 14.1 (H) 9.1 - 12.0 sec      Assessment & Plan:   Problem List Items Addressed This Visit      Cardiovascular and Mediastinum   Coronary artery disease (Chronic)    Patient to f/u with cardiologist with his reduced exercise tolerance        Endocrine   Type 2 diabetes, uncontrolled, with neuropathy ([place]) - Primary (Chronic)    Foot exam by MD today; labs through [place]; I don't think continue sulfonylurea is useful at this point, with the amount of insulin that he uses, so this has been stopped; healthy eating encouraged        Hematopoietic and Hemostatic   Factor V Leiden mutation ([place]) (Chronic)    Continue life-long coumadin therapy; patient has not been compliant but says he'll do better        Other   Right shoulder pain    F/u with ortho      Intermittent explosive disorder (Chronic)      Reviewed up-to-date with him; suggested some medications that he can talk with his psychiatrist about, as I don't know what has  been tried in the past, but I wanted patient to know that there are medication therapies that might be helpful; see AVS      Hyperlipidemia (Chronic)    Labs through [place]; limit foods with saturated fats      Anticoagulation goal of INR 2 to 3 (Chronic)    Check INR and adjust as needed       Other Visit Diagnoses    Needs flu shot       Relevant Orders   Flu vaccine HIGH DOSE PF (Fluzone High dose) (Completed)       Follow up plan: Return in about 1 month (around 02/07/2016) for INR check.  An after-visit summary was printed and given to the patient at [place].  Please see the patient instructions which may contain other information and recommendations beyond what is mentioned above in the assessment and plan.  Meds ordered this encounter  Medications  . Alcohol Swabs (B-D SINGLE USE SWABS REGULAR) PADS  . Blood Glucose Monitoring Suppl (ACCU-CHEK NANO SMARTVIEW) w/Device KIT    Orders Placed This Encounter  Procedures  . Flu vaccine HIGH DOSE PF (Fluzone High dose)

## 2016-01-08 LAB — PROTIME-INR
INR: 1.4 — ABNORMAL HIGH (ref 0.8–1.2)
Prothrombin Time: 14.1 s — ABNORMAL HIGH (ref 9.1–12.0)

## 2016-01-09 NOTE — Progress Notes (Signed)
Dr. Sherie DonLada pt states he is taking 7.5 mg daily in the morning  and he only missed one dosage.

## 2016-01-13 DIAGNOSIS — M25511 Pain in right shoulder: Secondary | ICD-10-CM | POA: Insufficient documentation

## 2016-01-13 NOTE — Assessment & Plan Note (Signed)
Foot exam by MD today; [facility]; I don't think continue sulfonylurea is useful at this point, with the amount of insulin that he uses, so this has been stopped; healthy eating encouraged

## 2016-01-13 NOTE — Assessment & Plan Note (Signed)
Reviewed up-to-date with him; suggested some medications that he can talk with his psychiatrist about, as I don't know what has been tried in the past, but I wanted patient to know that there are medication therapies that might be helpful; see AVS

## 2016-01-13 NOTE — Assessment & Plan Note (Signed)
F/u with ortho 

## 2016-01-13 NOTE — Assessment & Plan Note (Signed)
[facility]; limit foods with saturated fats

## 2016-01-13 NOTE — Assessment & Plan Note (Signed)
Patient to f/u with cardiologist with his reduced exercise tolerance

## 2016-01-13 NOTE — Assessment & Plan Note (Signed)
Continue life-long coumadin therapy; patient has not been compliant but says he'll do better

## 2016-01-13 NOTE — Assessment & Plan Note (Signed)
Check INR and adjust as needed

## 2016-01-23 ENCOUNTER — Telehealth: Payer: Self-pay

## 2016-01-23 MED ORDER — METFORMIN HCL 1000 MG PO TABS: 500.0000 mg | ORAL_TABLET | Freq: Two times a day (BID) | ORAL | 1 refills | Status: DC

## 2016-01-23 NOTE — Telephone Encounter (Signed)
Please suggest that he spread the medicine out a little, taking 500 mg twice a day (best if taken 15-30 minutes before morning and evening meal) Thank him for updating us

## 2016-01-23 NOTE — Telephone Encounter (Signed)
Left detailed voicemail

## 2016-01-23 NOTE — Telephone Encounter (Signed)
Patient called to let you know that the metformin 2000mg  a day is giving him bad diarrhea, and he has cut back to 1000mg  a day.

## 2016-02-04 ENCOUNTER — Telehealth: Payer: Self-pay | Admitting: Family Medicine

## 2016-02-04 DIAGNOSIS — D6851 Activated protein C resistance: Secondary | ICD-10-CM

## 2016-02-04 NOTE — Telephone Encounter (Signed)
Order entered thank you

## 2016-02-04 NOTE — Telephone Encounter (Signed)
PT NEEDS A ORDER TO HAVE HIS PT/INR DONE OVER AT [employer] SINCE HE USE TO WORK THERE. PLEASE SET UP ORDER

## 2016-02-04 NOTE — Assessment & Plan Note (Signed)
Check INR; goal 2-3

## 2016-02-05 ENCOUNTER — Other Ambulatory Visit: Payer: Self-pay

## 2016-02-05 DIAGNOSIS — D6851 Activated protein C resistance: Secondary | ICD-10-CM

## 2016-02-07 ENCOUNTER — Ambulatory Visit: Payer: Commercial Managed Care - HMO | Admitting: Family Medicine

## 2016-03-31 ENCOUNTER — Encounter: Payer: Self-pay | Admitting: Family Medicine

## 2016-03-31 ENCOUNTER — Ambulatory Visit (INDEPENDENT_AMBULATORY_CARE_PROVIDER_SITE_OTHER): Payer: Medicare HMO | Admitting: Family Medicine

## 2016-03-31 DIAGNOSIS — E1140 Type 2 diabetes mellitus with diabetic neuropathy, unspecified: Secondary | ICD-10-CM | POA: Diagnosis not present

## 2016-03-31 DIAGNOSIS — E1165 Type 2 diabetes mellitus with hyperglycemia: Secondary | ICD-10-CM

## 2016-03-31 DIAGNOSIS — E782 Mixed hyperlipidemia: Secondary | ICD-10-CM | POA: Diagnosis not present

## 2016-03-31 DIAGNOSIS — Z125 Encounter for screening for malignant neoplasm of prostate: Secondary | ICD-10-CM | POA: Insufficient documentation

## 2016-03-31 DIAGNOSIS — Z5181 Encounter for therapeutic drug level monitoring: Secondary | ICD-10-CM | POA: Diagnosis not present

## 2016-03-31 DIAGNOSIS — I10 Essential (primary) hypertension: Secondary | ICD-10-CM | POA: Diagnosis not present

## 2016-03-31 DIAGNOSIS — Z7901 Long term (current) use of anticoagulants: Secondary | ICD-10-CM

## 2016-03-31 DIAGNOSIS — IMO0002 Reserved for concepts with insufficient information to code with codable children: Secondary | ICD-10-CM

## 2016-03-31 MED ORDER — NITROGLYCERIN 0.4 MG SL SUBL
0.4000 mg | SUBLINGUAL_TABLET | SUBLINGUAL | 1 refills | Status: DC | PRN
Start: 2016-03-31 — End: 2018-09-08

## 2016-03-31 MED ORDER — EZETIMIBE 10 MG PO TABS: 10.0000 mg | ORAL_TABLET | Freq: Every day | ORAL | 3 refills | Status: DC

## 2016-03-31 MED ORDER — ATENOLOL 50 MG PO TABS: 50.0000 mg | ORAL_TABLET | Freq: Two times a day (BID) | ORAL | 3 refills | Status: DC

## 2016-03-31 MED ORDER — LISINOPRIL 40 MG PO TABS: 40.0000 mg | ORAL_TABLET | Freq: Every day | ORAL | 1 refills | Status: DC

## 2016-03-31 NOTE — Patient Instructions (Addendum)
  Start the [employer] Keep doing all the good things you are doing

## 2016-03-31 NOTE — Assessment & Plan Note (Signed)
Check A1c. 

## 2016-03-31 NOTE — Assessment & Plan Note (Signed)
Well-controlled, continue medicines

## 2016-03-31 NOTE — Progress Notes (Signed)
BP 124/76   Pulse 82   Temp 98.2 F (36.8 C) (Oral)   Resp 14   Wt 204 lb 6.4 oz (92.7 kg)   SpO2 96%   BMI 26.97 kg/m    Subjective:    Patient ID: Drew Morgan, male    DOB: 1941/02/21, 76 y.o.   MRN: [id number]  HPI: Drew Morgan is a 76 y.o. male  Chief Complaint  Patient presents with  . Follow-up    report from [place]   Patient is here for f/u; he goes to the [place] and brought some information with him; they monitor his diabetes and it is out of control He had an A1c over 9; they adjusted his insulin; no problems with his feet except for numbness in both He was also found to have an elevated liver enzyme ALT in the 80s; he denies abd pain He has high cholesterol; reviewed [facility] labs Does not tolerate the statins Would be willing to try zetia when we discussed it today  Flu shot UTD  No bleeding on coumadin He went to get level checked He'll get better coming in for checks he says  Depression screen [facility] 2/9 03/31/2016 01/07/2016 08/31/2015 07/30/2015 07/30/2015  Decreased Interest 0 0 0 3 0  Down, Depressed, Hopeless 1 0 1 3 1   PHQ - 2 Score 1 0 1 6 1   Altered sleeping - - - 3 -  Tired, decreased energy - - - 3 -  Change in appetite - - - 1 -  Feeling bad or failure about yourself  - - - 3 -  Trouble concentrating - - - 3 -  Moving slowly or fidgety/restless - - - 1 -  Suicidal thoughts - - - 0 -  PHQ-9 Score - - - 20 -  Difficult doing work/chores - - - Very difficult -   Relevant past medical, surgical, family and social history reviewed Past Medical History:  Diagnosis Date  . ADHD (attention deficit hyperactivity disorder) 07/30/2015  . Anticoagulation goal of INR 2 to 3 07/30/2015  . Arthritis    shoulder, both knees, hip, back  . Coronary artery disease   . Depression   . Diabetes mellitus without complication (H[facility])   . Factor V Leiden (H[facility])   . Hyperlipidemia   . Hypertension   . Insomnia   . Intermittent explosive disorder 07/30/2015  . OSA  (obstructive sleep apnea)   . Testicular dysfunction    Past Surgical History:  Procedure Laterality Date  . BACK SURGERY    . CORONARY ANGIOPLASTY WITH STENT PLACEMENT  1995  . HEMORRHOID SURGERY    . UVULOPLASTY     Family History  Problem Relation Age of Onset  . Anuerysm Father   . Depression Mother   . Depression Sister     taking venlafaxine  . Cancer Son     lung  . Heart disease Neg Hx    Social History  Substance Use Topics  . Smoking status: Former Smoker    Types: Cigarettes    Quit date: 01/07/1991  . Smokeless tobacco: Former Neurosurgeon    Types: Snuff     Comment: social smoker when he did smoke  . Alcohol use No    Interim medical history since last visit reviewed. Allergies and medications reviewed  Review of Systems Per HPI unless specifically indicated above     Objective:    BP 124/76   Pulse 82   Temp 98.2 F (36.8 C) (  Oral)   Resp 14   Wt 204 lb 6.4 oz (92.7 kg)   SpO2 96%   BMI 26.97 kg/m   Wt Readings from Last 3 Encounters:  03/31/16 204 lb 6.4 oz (92.7 kg)  01/07/16 218 lb 3.2 oz (99 kg)  08/31/15 216 lb (98 kg)    Physical Exam  Constitutional: He appears well-developed and well-nourished. No distress.  Eyes: No scleral icterus.  Neck: No JVD present.  Cardiovascular: Normal rate.   Pulmonary/Chest: Effort normal. No respiratory distress. He has no wheezes. He has no rales.  Abdominal: Soft. He exhibits no distension.  Musculoskeletal: He exhibits no edema.  Neurological: He is alert.  Skin: Skin is warm. He is not diaphoretic. No pallor.  Psychiatric: He has a normal mood and affect. His mood appears not anxious. His affect is not inappropriate. His speech is not rapid and/or pressured. He is not agitated, not aggressive, not hyperactive and not combative. Thought content is not paranoid. Cognition and memory are not impaired. He does not express impulsivity or inappropriate judgment. He does not exhibit a depressed mood. He  expresses no homicidal and no suicidal ideation.  Pleasant and cooperative; good eye contact with examiner   Diabetic Foot Form - Detailed   Diabetic Foot Exam - detailed Diabetic Foot exam was performed with the following findings:  Yes 03/31/2016  6:13 PM  Visual Foot Exam completed.:  Yes  Is there a history of foot ulcer?:  No Are the toenails ingrown?:  No Normal Range of Motion:  Yes Pulse Foot Exam completed.:  Yes  Right Dorsalis Pedis:  Present Left Dorsalis Pedis:  Present  Sensory Foot Exam Completed.:  Yes Swelling:  No Semmes-Weinstein Monofilament Test         Assessment & Plan:   Problem List Items Addressed This Visit      Cardiovascular and Mediastinum   Essential hypertension (Chronic)    Well-controlled, continue medicines      Relevant Medications   ezetimibe (ZETIA) 10 MG tablet   atenolol (TENORMIN) 50 MG tablet   lisinopril (PRINIVIL,ZESTRIL) 40 MG tablet   nitroGLYCERIN (NITROSTAT) 0.4 MG SL tablet     Endocrine   Type 2 diabetes, uncontrolled, with neuropathy (H[facility]) (Chronic)    Check A1c      Relevant Medications   lisinopril (PRINIVIL,ZESTRIL) 40 MG tablet   Other Relevant Orders   Hemoglobin A1c (Completed)     Other   Prostate cancer screening    Check PSA      Relevant Orders   PSA (Completed)   Medication monitoring encounter    Check labs      Relevant Orders   Comprehensive Metabolic Panel (CMET) (Completed)   Hyperlipidemia (Chronic)    Check labs, limit saturated fats      Relevant Medications   ezetimibe (ZETIA) 10 MG tablet   atenolol (TENORMIN) 50 MG tablet   lisinopril (PRINIVIL,ZESTRIL) 40 MG tablet   nitroGLYCERIN (NITROSTAT) 0.4 MG SL tablet   Other Relevant Orders   Lipid panel (Completed)   Anticoagulation goal of INR 2 to 3 (Chronic)    Check INR      Relevant Orders   INR/PT (Completed)      Follow up plan: Return in about 3 months (around 06/29/2016) for visit with Dr. Sherie Don.  An after-visit  summary was printed and given to the patient at check-out.  Please see the patient instructions which may contain other information and recommendations beyond what is mentioned above in  the assessment and plan.  Meds ordered this encounter  Medications  . ezetimibe (ZETIA) 10 MG tablet    Sig: Take 1 tablet (10 mg total) by mouth daily.    Dispense:  90 tablet    Refill:  3  . atenolol (TENORMIN) 50 MG tablet    Sig: Take 1 tablet (50 mg total) by mouth 2 (two) times daily.    Dispense:  180 tablet    Refill:  3  . lisinopril (PRINIVIL,ZESTRIL) 40 MG tablet    Sig: Take 1 tablet (40 mg total) by mouth daily.    Dispense:  90 tablet    Refill:  1  . nitroGLYCERIN (NITROSTAT) 0.4 MG SL tablet    Sig: Place 1 tablet (0.4 mg total) under the tongue every 5 (five) minutes as needed for chest pain. Up to 3 doses; call 911    Dispense:  25 tablet    Refill:  1    Orders Placed This Encounter  Procedures  . INR/PT  . Hemoglobin A1c  . Lipid panel  . Comprehensive Metabolic Panel (CMET)  . PSA

## 2016-03-31 NOTE — Assessment & Plan Note (Signed)
Check labs, limit saturated fats

## 2016-03-31 NOTE — Assessment & Plan Note (Signed)
Check PSA. ?

## 2016-03-31 NOTE — Assessment & Plan Note (Signed)
Check labs 

## 2016-03-31 NOTE — Assessment & Plan Note (Signed)
Check INR 

## 2016-04-01 ENCOUNTER — Telehealth: Payer: Self-pay | Admitting: Family Medicine

## 2016-04-01 DIAGNOSIS — Z5181 Encounter for therapeutic drug level monitoring: Secondary | ICD-10-CM

## 2016-04-01 DIAGNOSIS — Z7901 Long term (current) use of anticoagulants: Secondary | ICD-10-CM

## 2016-04-01 DIAGNOSIS — R740 Nonspecific elevation of levels of transaminase and lactic acid dehydrogenase [LDH]: Principal | ICD-10-CM

## 2016-04-01 DIAGNOSIS — D6851 Activated protein C resistance: Secondary | ICD-10-CM

## 2016-04-01 DIAGNOSIS — R7401 Elevation of levels of liver transaminase levels: Secondary | ICD-10-CM

## 2016-04-01 LAB — PROTIME-INR
INR: 1.4 — ABNORMAL HIGH (ref 0.8–1.2)
Prothrombin Time: 14.1 s — ABNORMAL HIGH (ref 9.1–12.0)

## 2016-04-01 LAB — COMPREHENSIVE METABOLIC PANEL
A/G RATIO: 1.5 (ref 1.2–2.2)
ALT: 62 IU/L — AB (ref 0–44)
AST: 64 IU/L — ABNORMAL HIGH (ref 0–40)
Albumin: 4.7 g/dL (ref 3.5–4.8)
Alkaline Phosphatase: 61 IU/L (ref 39–117)
BILIRUBIN TOTAL: 1.5 mg/dL — AB (ref 0.0–1.2)
BUN/Creatinine Ratio: 14 (ref 10–24)
BUN: 23 mg/dL (ref 8–27)
CALCIUM: 9.8 mg/dL (ref 8.6–10.2)
CHLORIDE: 95 mmol/L — AB (ref 96–106)
CO2: 23 mmol/L (ref 18–29)
Creatinine, Ser: 1.59 mg/dL — ABNORMAL HIGH (ref 0.76–1.27)
GFR, EST AFRICAN AMERICAN: 49 mL/min/{1.73_m2} — AB (ref >59)
GFR, EST NON AFRICAN AMERICAN: 42 mL/min/{1.73_m2} — AB (ref >59)
Globulin, Total: 3.2 g/dL (ref 1.5–4.5)
Glucose: 165 mg/dL — ABNORMAL HIGH (ref 65–99)
Potassium: 5.4 mmol/L — ABNORMAL HIGH (ref 3.5–5.2)
Sodium: 139 mmol/L (ref 134–144)
TOTAL PROTEIN: 7.9 g/dL (ref 6.0–8.5)

## 2016-04-01 LAB — PSA: Prostate Specific Ag, Serum: 2.7 ng/mL (ref 0.0–4.0)

## 2016-04-01 LAB — LIPID PANEL
Chol/HDL Ratio: 7 ratio units — ABNORMAL HIGH (ref 0.0–5.0)
Cholesterol, Total: 237 mg/dL — ABNORMAL HIGH (ref 100–199)
HDL: 34 mg/dL — AB (ref >39)
TRIGLYCERIDES: 409 mg/dL — AB (ref 0–149)

## 2016-04-01 LAB — HEMOGLOBIN A1C
ESTIMATED AVERAGE GLUCOSE: 209 mg/dL
HEMOGLOBIN A1C: 8.9 % — AB (ref 4.8–5.6)

## 2016-04-01 NOTE — Assessment & Plan Note (Signed)
Check INR in 10-14 days

## 2016-04-01 NOTE — Assessment & Plan Note (Signed)
Check labs; [facility]

## 2016-04-01 NOTE — Telephone Encounter (Signed)
I called patient,s poke with him about results Avoid NSAIDs Drink lots of water Stop metformin Recheck kidneys in 10-14 days  Taking coumadin 7 mg daily and 10 mg on Wed 10 mg today Tuesdays and Fridays, 7 mg all other days Recheck INR in 10-14 days  Liver enzymes improved; I asked what the [facility] is going to do with his liver issue; he'll address with [facility] because they can do his testing for free; I suggested he ask her to have an US done of his liver  We discussed his other labs

## 2016-04-01 NOTE — Assessment & Plan Note (Signed)
Check INR 10-14 days

## 2016-04-16 ENCOUNTER — Other Ambulatory Visit: Payer: Self-pay

## 2016-04-16 ENCOUNTER — Other Ambulatory Visit: Payer: Self-pay | Admitting: Family Medicine

## 2016-04-16 DIAGNOSIS — R7401 Elevation of levels of liver transaminase levels: Secondary | ICD-10-CM

## 2016-04-16 DIAGNOSIS — Z7901 Long term (current) use of anticoagulants: Secondary | ICD-10-CM

## 2016-04-16 DIAGNOSIS — R740 Nonspecific elevation of levels of transaminase and lactic acid dehydrogenase [LDH]: Secondary | ICD-10-CM | POA: Diagnosis not present

## 2016-04-16 DIAGNOSIS — D6851 Activated protein C resistance: Secondary | ICD-10-CM | POA: Diagnosis not present

## 2016-04-16 DIAGNOSIS — Z5181 Encounter for therapeutic drug level monitoring: Secondary | ICD-10-CM

## 2016-04-17 LAB — COMPREHENSIVE METABOLIC PANEL
A/G RATIO: 1.5 (ref 1.2–2.2)
ALBUMIN: 4.6 g/dL (ref 3.5–4.8)
ALT: 49 IU/L — AB (ref 0–44)
AST: 58 IU/L — ABNORMAL HIGH (ref 0–40)
Alkaline Phosphatase: 65 IU/L (ref 39–117)
BILIRUBIN TOTAL: 1.2 mg/dL (ref 0.0–1.2)
BUN / CREAT RATIO: 17 (ref 10–24)
BUN: 19 mg/dL (ref 8–27)
CALCIUM: 9.3 mg/dL (ref 8.6–10.2)
CO2: 21 mmol/L (ref 18–29)
Chloride: 99 mmol/L (ref 96–106)
Creatinine, Ser: 1.12 mg/dL (ref 0.76–1.27)
GFR, EST AFRICAN AMERICAN: 75 mL/min/{1.73_m2} (ref >59)
GFR, EST NON AFRICAN AMERICAN: 65 mL/min/{1.73_m2} (ref >59)
GLOBULIN, TOTAL: 3 g/dL (ref 1.5–4.5)
Glucose: 209 mg/dL — ABNORMAL HIGH (ref 65–99)
POTASSIUM: 5 mmol/L (ref 3.5–5.2)
SODIUM: 138 mmol/L (ref 134–144)
TOTAL PROTEIN: 7.6 g/dL (ref 6.0–8.5)

## 2016-04-17 LAB — PROTIME-INR
INR: 1.6 — ABNORMAL HIGH (ref 0.8–1.2)
Prothrombin Time: 16.1 s — ABNORMAL HIGH (ref 9.1–12.0)

## 2016-04-24 ENCOUNTER — Telehealth: Payer: Self-pay

## 2016-04-24 NOTE — Telephone Encounter (Signed)
See if he would be open to adding a once a day injectable called Victoza; it is NOT insulin; it would help his diabetes, may help him with weight control, and also help lower post-prandial sugars and bring his A1c down Again, it is not insulin and would not replace his insulin If he starts it, I we would like need to have him start reducing his insulin use Let me know

## 2016-04-24 NOTE — Telephone Encounter (Signed)
Patient notified will check with insurance to see if they will cover or cost.

## 2016-04-24 NOTE — Telephone Encounter (Signed)
Patient called is concerned about blood sugar reading, he is currently taking the levemir  60 units at bedtime.  Avg reading is over 170 and he states is eating healthy.  Please advise?

## 2016-04-29 ENCOUNTER — Telehealth: Payer: Self-pay | Admitting: Family Medicine

## 2016-04-29 NOTE — Telephone Encounter (Signed)
Pt would like a call back about his pinch nerve.

## 2016-04-30 MED ORDER — PIOGLITAZONE HCL 15 MG PO TABS: 15.0000 mg | ORAL_TABLET | Freq: Every day | ORAL | 3 refills | Status: DC

## 2016-04-30 NOTE — Telephone Encounter (Signed)
Yes, I like pioglitazone It can precipitate heart failure in people who already have heart failure, so if he's ever been diagnosed with heart failure, that's not a good option; if his heart pumps fine, then I think that's a good option I'll send in Rx at lowest starting dose of 15 mg once a day Keep up posted If he develops swelling ankles or SHOB, stop medicine and seek medical attention

## 2016-04-30 NOTE — Telephone Encounter (Signed)
Patient notified, will se [facility] tomorrow

## 2016-04-30 NOTE — Telephone Encounter (Signed)
Patient called his insurance and pioglitazone is covered by them is this something he can take to regulate his DM?  Please disregard previous message about pinched nerve, he will talk to [facility] about that

## 2016-05-05 ENCOUNTER — Other Ambulatory Visit: Payer: Self-pay

## 2016-05-05 DIAGNOSIS — D6851 Activated protein C resistance: Secondary | ICD-10-CM

## 2016-05-05 MED ORDER — LIRAGLUTIDE 18 MG/3ML ~~LOC~~ SOPN: PEN_INJECTOR | SUBCUTANEOUS | 0 refills | Status: DC

## 2016-05-05 MED ORDER — EZETIMIBE 10 MG PO TABS: 10.0000 mg | ORAL_TABLET | Freq: Every day | ORAL | 3 refills | Status: DC

## 2016-05-05 MED ORDER — WARFARIN SODIUM 5 MG PO TABS: ORAL_TABLET | ORAL | 0 refills | Status: DC

## 2016-05-05 NOTE — Telephone Encounter (Signed)
Pt gave you the wrong fax number to [facility] [phone].  Also states that the medication that your going to order he is asking for a 90day supply. Also states that you had had increased his warfin and he will be out by next week. He is asking that you please contact [employer] with the refill and make sure it has the new increased dosage on it.

## 2016-05-05 NOTE — Telephone Encounter (Signed)
I printed out prescriptions for Zetia and Victoza, ready to send I sent Rx for 5 mg coumadin with new instructions to mail order Please clarify with patient exactly how he is taking his coumadin Chart says 7 mg and 10 mg, but if he's using 5 mg pills, it should say 7.5 mg and 10 mg; please just verify that he means 7.5 mg and not 7 mg on some days; thank you

## 2016-05-05 NOTE — Telephone Encounter (Signed)
Patient notified

## 2016-05-05 NOTE — Telephone Encounter (Signed)
Patient called  For the 3rd today is requesting you put explaination on rx for zetia that he needs this b/c statins cause joint pain and also for the victoza b/c the metformin is causing kidney problems.

## 2016-05-05 NOTE — Telephone Encounter (Signed)
Patient states [facility] and they have victoza and zetia that he can get cheaper there.  Wants you to write rx for both print out and I will fax to them.  He will take the victoza rather than the actos.  VA [phone]

## 2016-06-16 ENCOUNTER — Other Ambulatory Visit: Payer: Self-pay | Admitting: Family Medicine

## 2016-06-16 ENCOUNTER — Other Ambulatory Visit: Payer: Self-pay

## 2016-06-16 DIAGNOSIS — D6851 Activated protein C resistance: Secondary | ICD-10-CM

## 2016-06-17 LAB — PROTIME-INR
INR: 5.9 — ABNORMAL HIGH (ref 0.8–1.2)
Prothrombin Time: 55.4 s — ABNORMAL HIGH (ref 9.1–12.0)

## 2016-06-19 ENCOUNTER — Telehealth: Payer: Self-pay

## 2016-06-19 ENCOUNTER — Other Ambulatory Visit: Payer: Self-pay | Admitting: Family Medicine

## 2016-06-19 DIAGNOSIS — D6851 Activated protein C resistance: Secondary | ICD-10-CM | POA: Diagnosis not present

## 2016-06-19 LAB — PROTIME-INR
INR: 4.7 — AB (ref 0.8–1.2)
PROTHROMBIN TIME: 45.1 s — AB (ref 9.1–12.0)

## 2016-06-19 NOTE — Telephone Encounter (Signed)
Patient has to use [facility] for PT/INR's, He got done today but results most likely will not be in until tomorrow.  You advised him to stop coumadin Tuesday and Wednesday.  Please advise him what to do for today?

## 2016-06-19 NOTE — Telephone Encounter (Signed)
Went over to [facility] personally and told them to switch INR to STAT.

## 2016-06-19 NOTE — Telephone Encounter (Signed)
Please ask [facility] to run STAT so we get it before we leave today

## 2016-06-19 NOTE — Telephone Encounter (Signed)
Drew Morgan called back, He is not having any bleeding and states he will go on a med that you don:t have to get it checked, but needs to know what med before you prescribe to see if his ins covers or the [place] can give?  Also the only new med he has been taking is the gemfibrozil 600 mg bid can this effect it?   His current dosage just in case before you told him to hold was  S,W,F and 7.5mg  T, THur and Sat.

## 2016-06-19 NOTE — Telephone Encounter (Signed)
Beautiful No coumadin today or Friday or Saturday or Sunday Come in for labs on Monday and if INR is 2 or less, we'll start the pill Ask him to check with [facility] about: 1. Xarelto 2. Eliquis 3. Pradaxa If any of those are covered, I'll be glad to prescribe The gemfibrozil could certainly do this

## 2016-06-20 NOTE — Telephone Encounter (Signed)
Left detailed voicemail

## 2016-06-23 ENCOUNTER — Telehealth: Payer: Self-pay

## 2016-06-23 ENCOUNTER — Other Ambulatory Visit: Payer: Self-pay | Admitting: Family Medicine

## 2016-06-23 DIAGNOSIS — D6851 Activated protein C resistance: Secondary | ICD-10-CM | POA: Diagnosis not present

## 2016-06-23 NOTE — Telephone Encounter (Signed)
Patient called states got PT/INR drawn and has contacted [facility] and they can do Pradaxa for him.  On this they will need an explanation as to why you want to switch him from coumadin.  Please fax rx to [phone] with last labs to DR. Raji

## 2016-06-23 NOTE — Telephone Encounter (Signed)
Patient notified DR. Raji # [phone]

## 2016-06-23 NOTE — Telephone Encounter (Signed)
I can have him start Pradaxa as soon as his INR drops under 2 However, I would love to talk with his doctor at the [place] and see if they can manage this It seems like he is getting duplicate care If they can manage his blood thinner and diabetes and cholesterol, it's safest if just one of [place] is taking care of his chronic health conditions Please ask him for the phone number for his doctor and for permission for me to speak with him about this We are just wanting there to be the least confusion and potential for drug-drug interactions

## 2016-06-24 ENCOUNTER — Telehealth: Payer: Self-pay | Admitting: Family Medicine

## 2016-06-24 ENCOUNTER — Telehealth: Payer: Self-pay

## 2016-06-24 LAB — PROTIME-INR
INR: 1.1 (ref 0.8–1.2)
PROTHROMBIN TIME: 11.9 s (ref 9.1–12.0)

## 2016-06-24 MED ORDER — DABIGATRAN ETEXILATE MESYLATE 150 MG PO CAPS
150.0000 mg | ORAL_CAPSULE | Freq: Two times a day (BID) | ORAL | 0 refills | Status: DC
Start: 2016-06-24 — End: 2016-07-01

## 2016-06-24 NOTE — Telephone Encounter (Signed)
Patient called states he cannot afford pradaxa and  He will need an appt before they will fill.  So current dose of coumadin per Dr. Sherie Don is 7.5mg  everyday except Sunday 10 mg recheck in 1 week. Patient verbalized understanding.

## 2016-06-24 NOTE — Telephone Encounter (Signed)
I spoke w/patient a few hours ago; he cannot afford Pradaxa He is subtherapeutic He does not want Lovenox, $$$ He will start back on coumadin 7.5 mg daily 6 days per week and 10 mg 1 day per week Recheck in one week

## 2016-06-24 NOTE — Telephone Encounter (Signed)
Done, faxed

## 2016-06-24 NOTE — Telephone Encounter (Signed)
I called Dr. Wendi Snipes; left message asking for return call asap INR now 1.1 Please call patient and let him know we should start his Pradaxa right away His INR is under 2 and has dropped all the way to 1.1, so we want him to start the Pradaxa soon and he'll have protection against clot within 1-3 hours I'm waiting to talk to Dr. Wendi Snipes to see if [facility] will prescribe or I will, but I want him to start right away If he needs to pick up a few tablets at the pharmacy to get this started while awaiting prior approval, please do so

## 2016-06-24 NOTE — Telephone Encounter (Signed)
Dr. 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

## 2016-06-24 NOTE — Telephone Encounter (Signed)
I called patient, left message Let's have him take 10 mg total today (Tuesday) 10 mg total tomorrow (Wednesday) Then 7.5 mg daily except 10 mg on Sundays Call w/questions

## 2016-06-26 ENCOUNTER — Telehealth: Payer: Self-pay | Admitting: Family Medicine

## 2016-06-26 NOTE — Telephone Encounter (Signed)
Cr 1.2 ALT and AST coming down They are going to try Xarelto; they are managing the anticoagulation now; they are having him take regular coumadin dose Her name is Drew Morgan, [phone], at the [facility] [phone] They are working with him on his sugars; 223, 193, 174, not checked today; they keep increasing his dosage

## 2016-07-01 ENCOUNTER — Ambulatory Visit (INDEPENDENT_AMBULATORY_CARE_PROVIDER_SITE_OTHER): Payer: Medicare HMO | Admitting: Family Medicine

## 2016-07-01 ENCOUNTER — Encounter: Payer: Self-pay | Admitting: Family Medicine

## 2016-07-01 ENCOUNTER — Other Ambulatory Visit: Payer: Self-pay | Admitting: Family Medicine

## 2016-07-01 VITALS — BP 126/80 | HR 83 | Temp 97.8°F | Resp 14 | Wt 206.0 lb

## 2016-07-01 DIAGNOSIS — D6851 Activated protein C resistance: Secondary | ICD-10-CM

## 2016-07-01 DIAGNOSIS — I10 Essential (primary) hypertension: Secondary | ICD-10-CM

## 2016-07-01 DIAGNOSIS — E782 Mixed hyperlipidemia: Secondary | ICD-10-CM

## 2016-07-01 DIAGNOSIS — S90414A Abrasion, right lesser toe(s), initial encounter: Secondary | ICD-10-CM | POA: Diagnosis not present

## 2016-07-01 DIAGNOSIS — Z638 Other specified problems related to primary support group: Secondary | ICD-10-CM

## 2016-07-01 DIAGNOSIS — F6381 Intermittent explosive disorder: Secondary | ICD-10-CM

## 2016-07-01 NOTE — Telephone Encounter (Signed)
Was seen today and forgot to mention that he need refills on all his medications except warfin (could not remember the name of them all). Please send to [facility] mail order pharmacy [phone]

## 2016-07-01 NOTE — Telephone Encounter (Signed)
My understanding is that the [place] doctor manages his medicines, adjusts them, monitors them and prescribes them now Please clarify, because I do not want him to run out of medicines I specifically believe the Xarelto is prescribed and [facility] for sure If this is not true, please have the [place] doctor call me so we can hash out who is covering what health problem and who is prescribing which drug

## 2016-07-02 DIAGNOSIS — Z638 Other specified problems related to primary support group: Secondary | ICD-10-CM | POA: Insufficient documentation
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
## 2016-07-02 NOTE — Assessment & Plan Note (Signed)
Supportive listening provided; appreciate patient also bringing in paperwork from [employer] from years ago; see copy on the chart; he will continue to work with therapist at the [place]

## 2016-07-02 NOTE — Assessment & Plan Note (Signed)
[facility] with Factor Xa inhibitor

## 2016-07-02 NOTE — Assessment & Plan Note (Signed)
Cause of family tension; continue to f/u with [facility] therapist

## 2016-07-03 MED ORDER — LISINOPRIL 40 MG PO TABS: 40.0000 mg | ORAL_TABLET | Freq: Every day | ORAL | 1 refills | Status: DC

## 2016-07-03 MED ORDER — ATENOLOL 50 MG PO TABS: 50.0000 mg | ORAL_TABLET | Freq: Two times a day (BID) | ORAL | 3 refills | Status: DC

## 2016-07-03 MED ORDER — AMLODIPINE BESYLATE 5 MG PO TABS: 5.0000 mg | ORAL_TABLET | Freq: Every day | ORAL | 3 refills | Status: DC

## 2016-07-03 NOTE — Telephone Encounter (Signed)
Spoke with pt. He clarified which ones his [place] doctor prescribes. Pt only need those three.

## 2016-07-03 NOTE — Telephone Encounter (Signed)
Thank you so much Rxs sent

## 2016-08-05 ENCOUNTER — Ambulatory Visit (INDEPENDENT_AMBULATORY_CARE_PROVIDER_SITE_OTHER): Payer: Medicare HMO | Admitting: Family Medicine

## 2016-08-05 ENCOUNTER — Encounter: Payer: Self-pay | Admitting: Family Medicine

## 2016-08-05 DIAGNOSIS — Z638 Other specified problems related to primary support group: Secondary | ICD-10-CM

## 2016-08-05 DIAGNOSIS — E1140 Type 2 diabetes mellitus with diabetic neuropathy, unspecified: Secondary | ICD-10-CM | POA: Diagnosis not present

## 2016-08-05 DIAGNOSIS — E782 Mixed hyperlipidemia: Secondary | ICD-10-CM

## 2016-08-05 DIAGNOSIS — I10 Essential (primary) hypertension: Secondary | ICD-10-CM | POA: Diagnosis not present

## 2016-08-05 DIAGNOSIS — E1165 Type 2 diabetes mellitus with hyperglycemia: Secondary | ICD-10-CM | POA: Diagnosis not present

## 2016-08-05 DIAGNOSIS — IMO0002 Reserved for concepts with insufficient information to code with codable children: Secondary | ICD-10-CM

## 2016-08-05 NOTE — Assessment & Plan Note (Signed)
Encouragement provided, as well as supportive listening

## 2016-08-05 NOTE — Assessment & Plan Note (Signed)
[facility] foot exam here by MD today

## 2016-08-05 NOTE — Assessment & Plan Note (Signed)
So glad his TG have come down more than 260 points; this is also managed through [facility]

## 2016-08-05 NOTE — Assessment & Plan Note (Signed)
Well controlled 

## 2016-08-05 NOTE — Progress Notes (Signed)
BP 122/84   Pulse 77   Temp 97.8 F (36.6 C) (Oral)   Resp 16   Wt 202 lb 6.4 oz (91.8 kg)   SpO2 95%   BMI 26.70 kg/m    Subjective:    Patient ID: Drew Morgan, male    DOB: 03/24/1940, 76 y.o.   MRN: [id number]  HPI: Drew Morgan is a 76 y.o. male  Chief Complaint  Patient presents with  . Follow-up    HPI Patient is here for f/u of several things; he finds our visits emotionally uplifting in regards to his mental health, PTSD; he is seeing psychiatrist at the [facility] and is on medicine  TG dropped 261 points with gemfibrozil and that was only after one month of treatment He brought in his readings from the [facility]  Diabetes; working hard on diet; insulin shots 4x a day; subs on wheat Not eating rice or pasta; salmon; eats a lot of garlic; puts cinnamon in his oatmeal Next eye exam one month at the [facility]  [facility] 08/05/2016 07/01/2016 03/31/2016 01/07/2016 08/31/2015  Decreased Interest 0 0 0 0 0  Down, Depressed, Hopeless 1 1 1  0 1  PHQ - 2 Score 1 1 1  0 1  Altered sleeping - - - - -  Tired, decreased energy - - - - -  Change in appetite - - - - -  Feeling bad or failure about yourself  - - - - -  Trouble concentrating - - - - -  Moving slowly or fidgety/restless - - - - -  Suicidal thoughts - - - - -  PHQ-9 Score - - - - -  Difficult doing work/chores - - - - -    Relevant past medical, surgical, family and social history reviewed Past Medical History:  Diagnosis Date  . ADHD (attention deficit hyperactivity disorder) 07/30/2015  . Anticoagulation goal of INR 2 to 3 07/30/2015  . Arthritis    shoulder, both knees, hip, back  . Coronary artery disease   . Depression   . Diabetes mellitus without complication (HCC)   . Factor V Leiden (HCC)   . Hyperlipidemia   . Hypertension   . Insomnia   . Intermittent explosive disorder 07/30/2015  . OSA (obstructive sleep apnea)   . Testicular dysfunction    Family History  Problem Relation Age of Onset   . Anuerysm Father   . Depression Mother   . Depression Sister        taking venlafaxine  . Cancer Son        lung  . Heart disease Neg Hx    Social History   Social History  . Marital status: Married    Spouse name: N/A  . Number of children: N/A  . Years of education: N/A   Occupational History  . Not on file.   Social History Main Topics  . Smoking status: Former Smoker    Types: Cigarettes    Quit date: 01/07/1991  . Smokeless tobacco: Former NeurosurgeonUser    Types: Snuff     Comment: social smoker when he did smoke  . Alcohol use No  . Drug use: No  . Sexual activity: No   Other Topics Concern  . Not on file   Social History Narrative  . No narrative on file    Interim medical history since last visit reviewed. Allergies and medications reviewed  Review of Systems Per HPI unless specifically indicated above  Objective:    BP 122/84   Pulse 77   Temp 97.8 F (36.6 C) (Oral)   Resp 16   Wt 202 lb 6.4 oz (91.8 kg)   SpO2 95%   BMI 26.70 kg/m   Wt Readings from Last 3 Encounters:  08/05/16 202 lb 6.4 oz (91.8 kg)  07/01/16 206 lb (93.4 kg)  03/31/16 204 lb 6.4 oz (92.7 kg)    Physical Exam  Constitutional: He appears well-developed and well-nourished. No distress.  Eyes: No scleral icterus.  Cardiovascular: Normal rate and regular rhythm.   Pulmonary/Chest: Effort normal and breath sounds normal.  Neurological: He is alert.  Psychiatric: He has a normal mood and affect.   Diabetic Foot Form - Detailed   Diabetic Foot Exam - detailed Diabetic Foot exam was performed with the following findings:  Yes 08/05/2016  9:04 AM  Visual Foot Exam completed.:  Yes  Are the toenails ingrown?:  No Normal Range of Motion:  Yes Pulse Foot Exam completed.:  Yes  Right Dorsalis Pedis:  Present Left Dorsalis Pedis:  Present  Swelling:  No Semmes-Weinstein Monofilament Test R Site 1-Great Toe:  Pos L Site 1-Great Toe:  Pos  R Site 4:  Pos L Site 4:  Pos  R Site  5:  Pos L Site 5:  Pos       Results for orders placed or performed in visit on 06/23/16  Protime-INR  Result Value Ref Range   INR 1.1 0.8 - 1.2   Prothrombin Time 11.9 9.1 - 12.0 sec      Assessment & Plan:   Problem List Items Addressed This Visit      Cardiovascular and Mediastinum   Essential hypertension (Chronic)    Well-controlled        Endocrine   Type 2 diabetes, uncontrolled, with neuropathy (HCC) (Chronic)    [facility]; foot exam here by MD today        Other   Stress due to family tension    Encouragement provided, as well as supportive listening      Hyperlipidemia (Chronic)    So glad his TG have come down more than 260 points; this is also managed through [place]          Follow up plan: Return in about 1 month (around 09/05/2016) for follow-up visit with Dr. Sherie Don.  An after-visit summary was printed and given to the patient at check-out.  Please see the patient instructions which may contain other information and recommendations beyond what is mentioned above in the assessment and plan.  No orders of the defined types were placed in this encounter.   No orders of the defined types were placed in this encounter.

## 2016-08-05 NOTE — Patient Instructions (Signed)
Return in one month.  

## 2016-09-11 ENCOUNTER — Ambulatory Visit (INDEPENDENT_AMBULATORY_CARE_PROVIDER_SITE_OTHER): Payer: Medicare HMO | Admitting: Family Medicine

## 2016-09-11 ENCOUNTER — Encounter: Payer: Self-pay | Admitting: Family Medicine

## 2016-09-11 DIAGNOSIS — F6381 Intermittent explosive disorder: Secondary | ICD-10-CM

## 2016-09-11 DIAGNOSIS — Z638 Other specified problems related to primary support group: Secondary | ICD-10-CM | POA: Diagnosis not present

## 2016-09-11 DIAGNOSIS — E1140 Type 2 diabetes mellitus with diabetic neuropathy, unspecified: Secondary | ICD-10-CM

## 2016-09-11 DIAGNOSIS — I10 Essential (primary) hypertension: Secondary | ICD-10-CM | POA: Diagnosis not present

## 2016-09-11 DIAGNOSIS — E1165 Type 2 diabetes mellitus with hyperglycemia: Secondary | ICD-10-CM

## 2016-09-11 DIAGNOSIS — IMO0002 Reserved for concepts with insufficient information to code with codable children: Secondary | ICD-10-CM

## 2016-09-11 NOTE — Assessment & Plan Note (Signed)
Excellent control today 

## 2016-09-11 NOTE — Assessment & Plan Note (Signed)
Seeing psychiatrist soon; wrote down a few other medicines for him to ask about; also advised he should never stop the venlfaxine cold [place], needs to be tapered with help of psych
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
## 2016-09-11 NOTE — Assessment & Plan Note (Signed)
Supportive listening; f/u with [facility] specialist; patient's faith keeps him from considering self-harm

## 2016-09-11 NOTE — Assessment & Plan Note (Signed)
Foot exam by MD today; [facility] checks A[facility]c and adjusts medicine

## 2016-09-11 NOTE — Patient Instructions (Signed)
Please do ask your psychiatrist if something like lamictal or topamax would be helpful Another option is brintellix Never stop the venlafaxine abruptly

## 2016-10-14 ENCOUNTER — Encounter: Payer: Self-pay | Admitting: Family Medicine

## 2016-10-14 ENCOUNTER — Ambulatory Visit (INDEPENDENT_AMBULATORY_CARE_PROVIDER_SITE_OTHER): Payer: Medicare HMO | Admitting: Family Medicine

## 2016-10-14 DIAGNOSIS — E1165 Type 2 diabetes mellitus with hyperglycemia: Secondary | ICD-10-CM

## 2016-10-14 DIAGNOSIS — M25561 Pain in right knee: Secondary | ICD-10-CM

## 2016-10-14 DIAGNOSIS — M25562 Pain in left knee: Secondary | ICD-10-CM | POA: Diagnosis not present

## 2016-10-14 DIAGNOSIS — IMO0002 Reserved for concepts with insufficient information to code with codable children: Secondary | ICD-10-CM

## 2016-10-14 DIAGNOSIS — F6381 Intermittent explosive disorder: Secondary | ICD-10-CM | POA: Diagnosis not present

## 2016-10-14 DIAGNOSIS — E1140 Type 2 diabetes mellitus with diabetic neuropathy, unspecified: Secondary | ICD-10-CM | POA: Diagnosis not present

## 2016-10-14 DIAGNOSIS — G8929 Other chronic pain: Secondary | ICD-10-CM

## 2016-10-14 DIAGNOSIS — I2510 Atherosclerotic heart disease of native coronary artery without angina pectoris: Secondary | ICD-10-CM | POA: Diagnosis not present

## 2016-10-14 MED ORDER — DICLOFENAC SODIUM 1 % TD GEL: 4.0000 g | Freq: Four times a day (QID) | TRANSDERMAL | 3 refills | Status: DC

## 2016-10-14 NOTE — Assessment & Plan Note (Signed)
[facility] glad to hear that his A1c is improving

## 2016-10-14 NOTE — Assessment & Plan Note (Signed)
Now on topiramate per [facility]; I gave him a list of [facility] counselors that he can contact and establish care with one to help him locally with his issues

## 2016-10-14 NOTE — Patient Instructions (Addendum)
Let's try the new voltaren (diclofenac) gel over the knees  [facility] [address]  [place], [place] [id number] [phone]    [facility] [address]  [place], [place] [phone] Call Dr. DaArdeth Sportsmanrl PikesSusan [phone]   Oscar LaJoanna Warren, MS, [facility] [address]  [place], [place] [id number] [phone]   Soyla MurphyBob Stogner LCSW [address]  [place], [place] [phone]

## 2016-10-14 NOTE — Assessment & Plan Note (Signed)
[facility]; I showed him a model of artery with atherosclerosis at different levels; he may have blockages, building collaterals over the last year preventing an event so far, but really want him to get evaluated, may need stent or other procedure; he says [facility]

## 2016-10-14 NOTE — Progress Notes (Signed)
BP 128/64   Pulse 64   Temp (!) 97.1 F (36.2 C) (Oral)   Resp 16   Wt 207 lb (93.9 kg)   SpO2 95%   BMI 27.31 kg/m    Subjective:    Patient ID: Drew Morgan, male    DOB: Jul 30, 1940, 76 y.o.   MRN: [id number]  HPI: Drew Morgan is a 76 y.o. male  Chief Complaint  Patient presents with  . Follow-up    HPI Patient is here for f/u He comments about the new medicine started by [facility], topiramate; seeing psychiatrist there; does not have local counselor or therapist; talks about rejection at home He has no stamina He'll check in to the ER soon he says and let them keep him and run tests He is concerned he may have some blockages Stamina is given out Going on for a year; he has told them about it at the [place] Getting progressively worse over the last six months; no chest pain Just no energy, lays in bed; uses carts at the stores He had a stent 22 years ago, Dr. Juliann Pares; he sees him too ([facility]) He has had two stress tests at Dr. Glennis Brink office, then [facility] Cannot exercise, cannot walk; out of breath all the time  A1c is down to 7.4; managed by [facility]  Knee pain; knee was operated on 40 years ago; took all the cartilage out he says; [place] has shot stuff in it; they have done the Synvisc  Depression screen [facility] 2/9 10/14/2016 09/11/2016 08/05/2016 07/01/2016 03/31/2016  Decreased Interest 3 0 0 0 0  Down, Depressed, Hopeless 3 0 1 1 1   PHQ - 2 Score 6 0 1 1 1   Altered sleeping 3 - - - -  Tired, decreased energy 3 - - - -  Change in appetite 1 - - - -  Feeling bad or failure about yourself  1 - - - -  Trouble concentrating 1 - - - -  Moving slowly or fidgety/restless 1 - - - -  Suicidal thoughts 1 - - - -  PHQ-9 Score 17 - - - -  Difficult doing work/chores Somewhat difficult - - - -    Relevant past medical, surgical, family and social history reviewed Past Medical History:  Diagnosis Date  . ADHD (attention deficit hyperactivity disorder) 07/30/2015   . Anticoagulation goal of INR 2 to 3 07/30/2015  . Arthritis    shoulder, both knees, hip, back  . Coronary artery disease   . Depression   . Diabetes mellitus without complication (HCC)   . Factor V Leiden (HCC)   . Hyperlipidemia   . Hypertension   . Insomnia   . Intermittent explosive disorder 07/30/2015  . OSA (obstructive sleep apnea)   . Testicular dysfunction    Past Surgical History:  Procedure Laterality Date  . BACK SURGERY    . CORONARY ANGIOPLASTY WITH STENT PLACEMENT  1995  . HEMORRHOID SURGERY    . UVULOPLASTY     Family History  Problem Relation Age of Onset  . Anuerysm Father   . Depression Mother   . Depression Sister        taking venlafaxine  . Cancer Son        lung  . Heart attack Paternal Grandfather   . Depression Sister   . Stroke Brother   . Heart disease Neg Hx    Social History   Social History  . Marital status:  Married    Spouse name: N/A  . Number of children: N/A  . Years of education: N/A   Occupational History  . Not on file.   Social History Main Topics  . Smoking status: Former Smoker    Types: Cigarettes    Quit date: 01/07/1991  . Smokeless tobacco: Former NeurosurgeonUser    Types: Snuff     Comment: social smoker when he did smoke  . Alcohol use No  . Drug use: No  . Sexual activity: No   Other Topics Concern  . Not on file   Social History Narrative  . No narrative on file    Interim medical history since last visit reviewed. Allergies and medications reviewed  Review of Systems Per HPI unless specifically indicated above     Objective:    BP 128/64   Pulse 64   Temp (!) 97.1 F (36.2 C) (Oral)   Resp 16   Wt 207 lb (93.9 kg)   SpO2 95%   BMI 27.31 kg/m   Wt Readings from Last 3 Encounters:  10/14/16 207 lb (93.9 kg)  09/11/16 206 lb 14.4 oz (93.8 kg)  08/05/16 202 lb 6.4 oz (91.8 kg)    Physical Exam  Constitutional: He appears well-developed and well-nourished. No distress.  Eyes: No scleral icterus.   Cardiovascular: Normal rate and regular rhythm.   Pulmonary/Chest: Effort normal and breath sounds normal.  Musculoskeletal:       Right knee: He exhibits no swelling and no effusion. No tenderness found.       Left knee: He exhibits normal range of motion and no swelling. No tenderness found.  Neurological: He is alert.  Psychiatric: He has a normal mood and affect. His behavior is normal. Judgment and thought content normal. His mood appears not anxious. His affect is not blunt. His speech is not delayed. He does not exhibit a depressed mood.  Good eye contact with examiner; did not appear despondent   Diabetic Foot Form - Detailed   Diabetic Foot Exam - detailed Diabetic Foot exam was performed with the following findings:  Yes 10/14/2016  9:11 AM  Visual Foot Exam completed.:  Yes  Is there swelling or and abnormal foot shape?:  No Pulse Foot Exam completed.:  Yes  Right Dorsalis Pedis:  Present Left Dorsalis Pedis:  Present  Sensory Foot Exam Completed.:  Yes Semmes-Weinstein Monofilament Test R Site 1-Great Toe:  Pos L Site 1-Great Toe:  Pos        Results for orders placed or performed in visit on 06/23/16  Protime-INR  Result Value Ref Range   INR 1.1 0.8 - 1.2   Prothrombin Time 11.9 9.1 - 12.0 sec      Assessment & Plan:   Problem List Items Addressed This Visit      Cardiovascular and Mediastinum   Coronary artery disease (Chronic)    [facility]; I showed him a model of artery with atherosclerosis at different levels; he may have blockages, building collaterals over the last year preventing an event so far, but really want him to get evaluated, may need stent or other procedure; he [facility]        Endocrine   Type 2 diabetes, uncontrolled, with neuropathy (HCC) (Chronic)    [facility]; glad to hear that his A1c is improving        Other   Knee pain, bilateral    Will  have him use voltaren gel; Rx sent       Intermittent explosive disorder (Chronic)    Now on topiramate per [facility] psychiatrist; I gave him a list of [facility] counselors that he can contact and establish care with one to help him locally with his issues          Follow up plan: No Follow-up on file.  An after-visit summary was printed and given to the patient at check-out.  Please see the patient instructions which may contain other information and recommendations beyond what is mentioned above in the assessment and plan.  Meds ordered this encounter  Medications  . topiramate (TOPAMAX) 25 MG tablet    Sig: Take 25 mg by mouth 2 (two) times daily.  . diclofenac sodium (VOLTAREN) 1 % GEL    Sig: Apply 4 g topically 4 (four) times daily. To each knee    Dispense:  300 g    Refill:  3    No orders of the defined types were placed in this encounter.  Face-to-face time with patient was more than 25 minutes, >50% time spent counseling and coordination of care

## 2016-10-14 NOTE — Assessment & Plan Note (Signed)
Will have him use voltaren gel; Rx sent

## 2016-10-28 ENCOUNTER — Ambulatory Visit: Payer: Medicare HMO | Admitting: Family Medicine

## 2016-11-03 ENCOUNTER — Encounter: Payer: Self-pay | Admitting: Family Medicine

## 2016-11-03 ENCOUNTER — Ambulatory Visit (INDEPENDENT_AMBULATORY_CARE_PROVIDER_SITE_OTHER): Payer: Medicare HMO | Admitting: Family Medicine

## 2016-11-03 VITALS — BP 126/72 | HR 75 | Temp 97.9°F | Resp 16 | Ht 73.0 in | Wt 207.7 lb

## 2016-11-03 DIAGNOSIS — Z0000 Encounter for general adult medical examination without abnormal findings: Secondary | ICD-10-CM | POA: Insufficient documentation

## 2016-11-03 DIAGNOSIS — E1140 Type 2 diabetes mellitus with diabetic neuropathy, unspecified: Secondary | ICD-10-CM

## 2016-11-03 DIAGNOSIS — R413 Other amnesia: Secondary | ICD-10-CM

## 2016-11-03 DIAGNOSIS — E1165 Type 2 diabetes mellitus with hyperglycemia: Secondary | ICD-10-CM

## 2016-11-03 DIAGNOSIS — M170 Bilateral primary osteoarthritis of knee: Secondary | ICD-10-CM | POA: Diagnosis not present

## 2016-11-03 DIAGNOSIS — Z1211 Encounter for screening for malignant neoplasm of colon: Secondary | ICD-10-CM

## 2016-11-03 DIAGNOSIS — Z5181 Encounter for therapeutic drug level monitoring: Secondary | ICD-10-CM

## 2016-11-03 DIAGNOSIS — Z8619 Personal history of other infectious and parasitic diseases: Secondary | ICD-10-CM | POA: Insufficient documentation

## 2016-11-03 DIAGNOSIS — Z125 Encounter for screening for malignant neoplasm of prostate: Secondary | ICD-10-CM | POA: Diagnosis not present

## 2016-11-03 DIAGNOSIS — IMO0002 Reserved for concepts with insufficient information to code with codable children: Secondary | ICD-10-CM

## 2016-11-03 LAB — HEMOCCULT GUIAC POC 1CARD (OFFICE)
FECAL OCCULT BLD: NEGATIVE
OCCULT BLOOD DATE: 8272018

## 2016-11-03 NOTE — Patient Instructions (Addendum)
I recommend formal cognitive testing through the [facility] to check for mild cognitive impairment The sooner this is addressed and medication started (if needed), the better off we'll be  On behalf of the [facility], thank you  Health Maintenance  Topic Date Due  . INFLUENZA VACCINE  11/03/2016 (Originally 10/08/2016)  . HEMOGLOBIN A1C  04/09/2017  . OPHTHALMOLOGY EXAM  09/03/2017  . FOOT EXAM  10/14/2017  . TETANUS/TDAP  02/05/2020  . COLONOSCOPY  08/31/2024  . PNA vac Low Risk Adult  Completed   Consider Shingrix, the new shingles vaccine Health Maintenance, Male A healthy lifestyle and preventive care is important for your health and wellness. Ask your health care provider about what schedule of regular examinations is right for you. What should I know about weight and diet? Eat a Healthy Diet  Eat plenty of vegetables, fruits, whole grains, low-fat dairy products, and lean protein.  Do not eat a lot of foods high in solid fats, added sugars, or salt.  Maintain a Healthy Weight Regular exercise can help you achieve or maintain a healthy weight. You should:  Do at least 150 minutes of exercise each week. The exercise should increase your heart rate and make you sweat (moderate-intensity exercise).  Do strength-training exercises at least twice a week.  Watch Your Levels of Cholesterol and Blood Lipids  Have your blood tested for lipids and cholesterol every 5 years starting at 74 years of age. If you are at high risk for heart disease, you should start having your blood tested when you are 74 years old. You may need to have your cholesterol levels checked more often if: ? Your lipid or cholesterol levels are high. ? You are older than 74 years of age. ? You are at high risk for heart disease.  What should I know about cancer screening? Many types of cancers can be detected early and may often be prevented. Lung Cancer  You should be  screened every year for lung cancer if: ? You are a current smoker who has smoked for at least 30 years. ? You are a former smoker who has quit within the past 15 years.  Talk to your health care provider about your screening options, when you should start screening, and how often you should be screened.  Colorectal Cancer  Routine colorectal cancer screening usually begins at 74 years of age and should be repeated every 5-10 years until you are 74 years old. You may need to be screened more often if early forms of precancerous polyps or small growths are found. Your health care provider may recommend screening at an earlier age if you have risk factors for colon cancer.  Your health care provider may recommend using home test kits to check for hidden blood in the stool.  A small camera at the end of a tube can be used to examine your colon (sigmoidoscopy or colonoscopy). This checks for the earliest forms of colorectal cancer.  Prostate and Testicular Cancer  Depending on your age and overall health, your health care provider may do certain tests to screen for prostate and testicular cancer.  Talk to your health care provider about any symptoms or concerns you have about testicular or prostate cancer.  Skin Cancer  Check your skin from head to toe regularly.  Tell your health care provider about any new moles or changes in moles, especially if: ? There is a change in a mole's size, shape, or color. ?  You have a mole that is larger than a pencil eraser.  Always use sunscreen. Apply sunscreen liberally and repeat throughout the day.  Protect yourself by wearing long sleeves, pants, a wide-brimmed hat, and sunglasses when outside.  What should I know about heart disease, diabetes, and high blood pressure?  If you are 87-89 years of age, have your blood pressure checked every 3-5 years. If you are 75 years of age or older, have your blood pressure checked every year. You should have  your blood pressure measured twice-once when you are at a hospital or clinic, and once when you are not at a hospital or clinic. Record the average of the two measurements. To check your blood pressure when you are not at a hospital or clinic, you can use: ? An automated blood pressure machine at a pharmacy. ? A home blood pressure monitor.  Talk to your health care provider about your target blood pressure.  If you are between 41-46 years old, ask your health care provider if you should take aspirin to prevent heart disease.  Have regular diabetes screenings by checking your fasting blood sugar level. ? If you are at a normal weight and have a low risk for diabetes, have this test once every three years after the age of 52. ? If you are overweight and have a high risk for diabetes, consider being tested at a younger age or more often.  A one-time screening for abdominal aortic aneurysm (AAA) by ultrasound is recommended for men aged 84-75 years who are current or former smokers. What should I know about preventing infection? Hepatitis B If you have a higher risk for hepatitis B, you should be screened for this virus. Talk with your health care provider to find out if you are at risk for hepatitis B infection. Hepatitis C Blood testing is recommended for:  Everyone born from [birth year] through 1963.  Anyone with known risk factors for hepatitis C.  Sexually Transmitted Diseases (STDs)  You should be screened each year for STDs including gonorrhea and chlamydia if: ? You are sexually active and are younger than 74 years of age. ? You are older than 74 years of age and your health care provider tells you that you are at risk for this type of infection. ? Your sexual activity has changed since you were last screened and you are at an increased risk for chlamydia or gonorrhea. Ask your health care provider if you are at risk.  Talk with your health care provider about whether you are at high risk  of being infected with HIV. Your health care provider may recommend a prescription medicine to help prevent HIV infection.  What else can I do?  Schedule regular health, dental, and eye exams.  Stay current with your vaccines (immunizations).  Do not use any tobacco products, such as cigarettes, chewing tobacco, and e-cigarettes. If you need help quitting, ask your health care provider.  Limit alcohol intake to no more than 2 drinks per day. One drink equals 12 ounces of beer, 5 ounces of wine, or 1 ounces of hard liquor.  Do not use street drugs.  Do not share needles.  Ask your health care provider for help if you need support or information about quitting drugs.  Tell your health care provider if you often feel depressed.  Tell your health care provider if you have ever been abused or do not feel safe at home. This information is not intended to replace advice given  to you by your health care provider. Make sure you discuss any questions you have with your health care provider. Document Released: 08/23/2007 Document Revised: 10/24/2015 Document Reviewed: 11/28/2014 Elsevier Interactive Patient Education  [employer].

## 2016-11-03 NOTE — Assessment & Plan Note (Signed)
USPSTF grade A and B recommendations reviewed with patient; age-appropriate recommendations, preventive care, screening tests, etc discussed and encouraged; healthy living encouraged; see AVS for patient education given to patient  

## 2016-11-03 NOTE — Assessment & Plan Note (Signed)
Monitored by [facility]

## 2016-11-03 NOTE — Progress Notes (Signed)
Patient: Drew Morgan, Male    DOB: Jun 05, 1940, 76 y.o.   MRN: [id number]  Visit Date: 11/03/2016  Today's Provider: Enid Derry, MD   Chief Complaint  Patient presents with  . Medicare Wellness    Subjective:   Drew Morgan is a 76 y.o. male who presents today for his Subsequent Annual Wellness Visit.  Caregiver input:  n/a  Short-term memory issues; frustrated all day walking by things; has to repeat things in his mind; can have a grocery list and walk right something in the store; motivation is lacking; humidity affects that; sometimes just sits and staires; handwriting is same size, but takes longer to write his name  USPSTF grade A and B recommendations Depression:  Depression screen [facility] 2/9 11/03/2016 10/14/2016 09/11/2016 08/05/2016 07/01/2016  Decreased Interest 0 3 0 0 0  Down, Depressed, Hopeless 1 3 0 1 1  PHQ - 2 Score 1 6 0 1 1  Altered sleeping - 3 - - -  Tired, decreased energy - 3 - - -  Change in appetite - 1 - - -  Feeling bad or failure about yourself  - 1 - - -  Trouble concentrating - 1 - - -  Moving slowly or fidgety/restless - 1 - - -  Suicidal thoughts - 1 - - -  PHQ-9 Score - 17 - - -  Difficult doing work/chores - Somewhat difficult - - -   Hypertension:controlled BP Readings from Last 3 Encounters:  11/03/16 126/72  10/14/16 128/64  09/11/16 118/62   Obesity: stable weight Wt Readings from Last 3 Encounters:  11/03/16 207 lb 11.2 oz (94.2 kg)  10/14/16 207 lb (93.9 kg)  09/11/16 206 lb 14.4 oz (93.8 kg)   BMI Readings from Last 3 Encounters:  11/03/16 27.40 kg/m  10/14/16 27.31 kg/m  09/11/16 27.30 kg/m    Alcohol: no, but thinking about one beer a day  Tobacco use: more than 15 years out HIV, hep B, hep C: already done Married Lipids: dietary indiscretion Lab Results  Component Value Date   CHOL 237 (H) 03/31/2016   CHOL 245 (H) 08/03/2015   CHOL 198 10/11/2014   Lab Results  Component Value Date   HDL 34 (L) 03/31/2016    HDL 38 (L) 08/03/2015   HDL 34 (L) 10/11/2014   Lab Results  Component Value Date   LDLCALC Comment 03/31/2016   [place] Comment 08/03/2015   [place] Comment 10/11/2014   Lab Results  Component Value Date   TRIG 409 (H) 03/31/2016   TRIG 679 ([place]) 08/03/2015   TRIG 610 ([place]) 10/11/2014   Lab Results  Component Value Date   CHOLHDL 7.0 (H) 03/31/2016   CHOLHDL 5.8 (H) 10/11/2014   No results found for: LDLDIRECT Glucose:  Glucose  Date Value Ref Range Status  04/16/2016 209 (H) 65 - 99 mg/dL Final  03/31/2016 165 (H) 65 - 99 mg/dL Final  08/03/2015 166 (H) 65 - 99 mg/dL Final  11/24/2011 122 (H) 65 - 99 mg/dL Final  07/04/2011 98 65 - 99 mg/dL Final   Colorectal cancer: 2016; probably 5 years; through the [place] Prostate cancer: check PSA and then DRE; always been slow for several years; gets up 2x a night No results found for: PSA Lung cancer:  n/a AAA: no AAA noted in 2010 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 ADHD (attention deficit hyperactivity disorder) 07/30/2015  . Anticoagulation goal of INR 2 to 3 07/30/2015  . Arthritis    shoulder, both knees, hip, back  . Coronary artery disease   . Depression   . Diabetes mellitus without complication ([place])   . Factor V Leiden ([place])   . Hyperlipidemia   . Hypertension   . Insomnia   . Intermittent explosive disorder 07/30/2015  . OSA (obstructive sleep apnea)   . Testicular dysfunction     Past Surgical History:  Procedure Laterality Date  . BACK SURGERY     . CORONARY ANGIOPLASTY WITH STENT PLACEMENT  1995  . HEMORRHOID SURGERY    . UVULOPLASTY      Family History  Problem Relation Age of Onset  . Anuerysm Father   . Depression Mother   . Depression Sister        taking venlafaxine  . Cancer Son        lung  . Heart attack Paternal Grandfather   . Depression Sister   . Stroke Brother   . Heart disease Neg Hx     Social History   Social History  . Marital status: Married    Spouse name: N/A  . Number of children: N/A  . Years of education: N/A   Occupational History  . Not on file.   Social History Main Topics  . Smoking status: Former Smoker    Types: Cigarettes    Quit date: 01/07/1991  . Smokeless tobacco: Former Systems developer    Types: Snuff     Comment: social smoker when he did smoke  . Alcohol use No  . Drug use: No  . Sexual activity: No   Other Topics Concern  . Not on file   Social History Narrative  . No narrative on file    Outpatient Encounter Prescriptions as of 11/03/2016  Medication Sig Note  . ACCU-CHEK FASTCLIX LANCETS MISC TEST BLOOD SUGAR THREE TIMES DAILY   . Alcohol Swabs (B-D SINGLE USE SWABS REGULAR) PADS  01/07/2016: Received from: External Pharmacy  . amLODipine (NORVASC) 5 MG tablet Take 1 tablet (5 mg total) by mouth daily.   Marland Kitchen atenolol (TENORMIN) 50 MG tablet Take 1 tablet (50 mg total) by mouth 2 (two) times daily.   . Blood Glucose Monitoring Suppl (ACCU-CHEK NANO SMARTVIEW) w/Device KIT  01/07/2016: Received from: External Pharmacy  . gemfibrozil (LOPID) 600 MG tablet Take 600 mg by mouth 2 (two) times daily before a meal.   . glucose blood test strip Test three times a day; Dx E11.65   . insulin NPH-regular Human (NOVOLIN 70/30) (70-30) 100 UNIT/ML injection Inject 70 Units into the skin 2 (two) times daily with a meal.    . Insulin Pen Needle (B-D ULTRAFINE III SHORT PEN) 31G X 8 MM MISC For use with insulin pen, use once a day; LON 99 months; Dx E11.65   . lisinopril (PRINIVIL,ZESTRIL)  40 MG tablet Take 1 tablet (40 mg total) by mouth daily.   . nitroGLYCERIN (NITROSTAT) 0.4 MG SL tablet Place 1 tablet (0.4 mg total) under the tongue every 5 (five) minutes as needed for  chest pain. Up to 3 doses; call 911   . rivaroxaban (XARELTO) 20 MG TABS tablet Take 20 mg by mouth daily.    Marland Kitchen topiramate (TOPAMAX) 25 MG tablet Take 25 mg by mouth 2 (two) times daily. 11/03/2016: Check dosage? States taking 3 in am and 3 in pm?  . venlafaxine XR (EFFEXOR-XR) 150 MG 24 hr capsule Take 150 mg by mouth daily with breakfast.   . diclofenac sodium (VOLTAREN) 1 % GEL Apply 4 g topically 4 (four) times daily. To each knee (Patient not taking: Reported on 11/03/2016) 11/03/2016: Never got to exspensive   No facility-administered encounter medications on file as of 11/03/2016.     Functional Ability / Safety Screening 1.  Was the timed Get Up and Go test longer than 30 seconds?  no 2.  Does the patient need help with the phone, transportation, shopping,      preparing meals, housework, laundry, medications, or managing money?  no 3.  Does the patient's home have:  loose throw rugs in the hallway?   no      Grab bars in the bathroom? no      Handrails on the stairs?   no stairs      Poor lighting?   no 4.  Has the patient noticed any hearing difficulties?   no; hears ringing every once in a while  Fall Risk Assessment See under rooming; knees are bone on bone; thought about knee replacement; mother died having hip replacement, blood clot  Depression Screen See under rooming Depression screen [facility] 2/9 11/03/2016 10/14/2016 09/11/2016 08/05/2016 07/01/2016  Decreased Interest 0 3 0 0 0  Down, Depressed, Hopeless 1 3 0 1 1  PHQ - 2 Score 1 6 0 1 1  Altered sleeping - 3 - - -  Tired, decreased energy - 3 - - -  Change in appetite - 1 - - -  Feeling bad or failure about yourself  - 1 - - -  Trouble concentrating - 1 - - -  Moving slowly or fidgety/restless - 1 - - -  Suicidal thoughts - 1 - - -  PHQ-9  Score - 17 - - -  Difficult doing work/chores - Somewhat difficult - - -    Advanced Directives Does patient have a HCPOA?    not sure if he has one If yes, name and contact information: would not want wife to be his HCPOA Does patient have a living will or MOST form?  yes but not sure; will look  Objective:   Vitals: BP 126/72   Pulse 75   Temp 97.9 F (36.6 C) (Oral)   Resp 16   Ht '6\' 1"'  (1.854 m)   Wt 207 lb 11.2 oz (94.2 kg)   SpO2 98%   BMI 27.40 kg/m  Body mass index is 27.4 kg/m. No exam data present  Physical Exam  Constitutional: He appears well-developed and well-nourished.  Cardiovascular: Normal rate.   Pulmonary/Chest: Effort normal.  Genitourinary: Prostate is enlarged (minimally enlarged). Prostate is not tender.  Skin: No pallor.  Psychiatric:  Good eye contact with examiner   Mood/affect:  euthymic Appearance:  Neatly dressed Diabetic Foot Form - Detailed   Diabetic Foot Exam - detailed Diabetic Foot exam was performed with the following findings:  Yes 11/03/2016  8:41 AM  Visual Foot Exam completed.:  Yes  Pulse Foot Exam completed.:  Yes  Right Dorsalis Pedis:  Present Left Dorsalis Pedis:  Present  Sensory Foot Exam Completed.:  Yes Semmes-Weinstein Monofilament Test R Site 1-Great Toe:  Pos L Site 1-Great Toe:  Pos        6CIT Screen 11/03/2016  What Year? 0 points  What month? 0 points  What time? 0 points  Count back from 20 2 points  Months in reverse 0 points  Repeat phrase 2 points  Total Score 4    Assessment & Plan:     Annual Wellness Visit  Reviewed patient's Family Medical History Reviewed and updated list of patient's medical providers Assessment of cognitive impairment was done Assessed patient's functional ability Established a written schedule for health screening [place] Completed and Reviewed  Immunization History  Administered Date(s) Administered  . Influenza, High Dose Seasonal PF  01/07/2016    Health Maintenance  Topic Date Due  . INFLUENZA VACCINE  11/03/2016 (Originally 10/08/2016)  . HEMOGLOBIN A1C  04/09/2017  . OPHTHALMOLOGY EXAM  09/03/2017  . FOOT EXAM  10/14/2017  . TETANUS/TDAP  02/05/2020  . COLONOSCOPY  08/31/2024  . PNA vac Low Risk Adult  Completed    Discussed health benefits of physical activity, and encouraged him to engage in regular exercise appropriate for his age and condition.   No orders of the defined types were placed in this encounter.   Current Outpatient Prescriptions:  .  ACCU-CHEK FASTCLIX LANCETS MISC, TEST BLOOD SUGAR THREE TIMES DAILY, Disp: 306 each, Rfl: 3 .  Alcohol Swabs (B-D SINGLE USE SWABS REGULAR) PADS, , Disp: , Rfl:  .  amLODipine (NORVASC) 5 MG tablet, Take 1 tablet (5 mg total) by mouth daily., Disp: 90 tablet, Rfl: 3 .  atenolol (TENORMIN) 50 MG tablet, Take 1 tablet (50 mg total) by mouth 2 (two) times daily., Disp: 180 tablet, Rfl: 3 .  Blood Glucose Monitoring Suppl (ACCU-CHEK NANO SMARTVIEW) w/Device KIT, , Disp: , Rfl:  .  gemfibrozil (LOPID) 600 MG tablet, Take 600 mg by mouth 2 (two) times daily before a meal., Disp: , Rfl:  .  glucose blood test strip, Test three times a day; Dx E11.65, Disp: 100 each, Rfl: 11 .  insulin NPH-regular Human (NOVOLIN 70/30) (70-30) 100 UNIT/ML injection, Inject 70 Units into the skin 2 (two) times daily with a meal. , Disp: , Rfl:  .  Insulin Pen Needle (B-D ULTRAFINE III SHORT PEN) 31G X 8 MM MISC, For use with insulin pen, use once a day; LON 99 months; Dx E11.65, Disp: 100 each, Rfl: 3 .  lisinopril (PRINIVIL,ZESTRIL) 40 MG tablet, Take 1 tablet (40 mg total) by mouth daily., Disp: 90 tablet, Rfl: 1 .  nitroGLYCERIN (NITROSTAT) 0.4 MG SL tablet, Place 1 tablet (0.4 mg total) under the tongue every 5 (five) minutes as needed for chest pain. Up to 3 doses; call 911, Disp: 25 tablet, Rfl: 1 .  rivaroxaban (XARELTO) 20 MG TABS tablet, Take 20 mg by mouth daily. , Disp: , Rfl:  .   topiramate (TOPAMAX) 25 MG tablet, Take 25 mg by mouth 2 (two) times daily., Disp: , Rfl:  .  venlafaxine XR (EFFEXOR-XR) 150 MG 24 hr capsule, Take 150 mg by mouth daily with breakfast., Disp: , Rfl:  .  diclofenac sodium (VOLTAREN) 1 % GEL, Apply 4 g topically 4 (four) times daily. To each knee (Patient not taking: Reported on 11/03/2016), Disp: 300 g, Rfl: 3 There are no discontinued medications.  Next Medicare Wellness Visit in 12+ months  Problem List Items Addressed This Visit      Endocrine  Type 2 diabetes, uncontrolled, with neuropathy (HCC) (Chronic)    [facility]      Relevant Orders   Hemoglobin A1c   Lipid panel     Musculoskeletal and Integument   Arthritis of both knees    Try turmeric, ice        Other   Prostate cancer screening    Check PSA and then DRE      Relevant Orders   PSA   Preventative health care - Primary    USPSTF grade A and B recommendations reviewed with patient; age-appropriate recommendations, preventive care, screening tests, etc discussed and encouraged; healthy living encouraged; see AVS for patient education given to patient       Memory difficulties    Suggested that he have formal testing done through the [place]; the sooner medication can be started to help slow progress, the better long-term; he will talk to [place] to see about getting tested, seeing neurologist      Medication monitoring encounter    Monitor labs      Relevant Orders   CBC with Differential/Platelet   Comprehensive metabolic panel   Hx of Lyme disease    Patient requesting labs today      Relevant Orders   Lyme Ab/Western Blot Reflex

## 2016-11-03 NOTE — Assessment & Plan Note (Signed)
Try turmeric, ice

## 2016-11-03 NOTE — Assessment & Plan Note (Signed)
Patient requesting labs today

## 2016-11-03 NOTE — Assessment & Plan Note (Signed)
Monitor labs 

## 2016-11-03 NOTE — Assessment & Plan Note (Signed)
Suggested that he have formal testing done through the [place]; the sooner medication can be started to help slow progress, the better long-term; he will talk to [place] to see about getting tested, seeing neurologist

## 2016-11-03 NOTE — Assessment & Plan Note (Signed)
Check PSA and then DRE 

## 2016-11-03 NOTE — Addendum Note (Signed)
Addended by: Marcos Eke C on: 11/03/2016 11:30 AM   Modules accepted: Orders

## 2016-11-04 LAB — PSA: PROSTATE SPECIFIC AG, SERUM: 1.9 ng/mL (ref 0.0–4.0)

## 2016-11-04 LAB — CBC WITH DIFFERENTIAL/PLATELET
BASOS ABS: 0.1 10*3/uL (ref 0.0–0.2)
BASOS: 1 %
EOS (ABSOLUTE): 0.3 10*3/uL (ref 0.0–0.4)
Eos: 3 %
Hematocrit: 40.6 % (ref 37.5–51.0)
Hemoglobin: 13.9 g/dL (ref 13.0–17.7)
Immature Grans (Abs): 0.1 10*3/uL (ref 0.0–0.1)
Immature Granulocytes: 1 %
Lymphocytes Absolute: 3.1 10*3/uL (ref 0.7–3.1)
Lymphs: 37 %
MCH: 30.3 pg (ref 26.6–33.0)
MCHC: 34.2 g/dL (ref 31.5–35.7)
MCV: 89 fL (ref 79–97)
Monocytes Absolute: 0.8 10*3/uL (ref 0.1–0.9)
Monocytes: 9 %
NEUTROS ABS: 4.2 10*3/uL (ref 1.4–7.0)
Neutrophils: 49 %
Platelets: 304 10*3/uL (ref 150–379)
RBC: 4.59 x10E6/uL (ref 4.14–5.80)
RDW: 13.6 % (ref 12.3–15.4)
WBC: 8.5 10*3/uL (ref 3.4–10.8)

## 2016-11-04 LAB — COMPREHENSIVE METABOLIC PANEL
A/G RATIO: 1.6 (ref 1.2–2.2)
ALK PHOS: 68 IU/L (ref 39–117)
ALT: 21 IU/L (ref 0–44)
AST: 30 IU/L (ref 0–40)
Albumin: 4.6 g/dL (ref 3.5–4.8)
BILIRUBIN TOTAL: 0.8 mg/dL (ref 0.0–1.2)
BUN / CREAT RATIO: 18 (ref 10–24)
BUN: 24 mg/dL (ref 8–27)
CO2: 18 mmol/L — ABNORMAL LOW (ref 20–29)
Calcium: 9.7 mg/dL (ref 8.6–10.2)
Chloride: 107 mmol/L — ABNORMAL HIGH (ref 96–106)
Creatinine, Ser: 1.37 mg/dL — ABNORMAL HIGH (ref 0.76–1.27)
GFR calc Af Amer: 59 mL/min/{1.73_m2} — ABNORMAL LOW (ref >59)
GFR, EST NON AFRICAN AMERICAN: 51 mL/min/{1.73_m2} — AB (ref >59)
GLUCOSE: 116 mg/dL — AB (ref 65–99)
Globulin, Total: 2.9 g/dL (ref 1.5–4.5)
Potassium: 4.9 mmol/L (ref 3.5–5.2)
SODIUM: 143 mmol/L (ref 134–144)
TOTAL PROTEIN: 7.5 g/dL (ref 6.0–8.5)

## 2016-11-04 LAB — LIPID PANEL
CHOL/HDL RATIO: 7.9 ratio — AB (ref 0.0–5.0)
Cholesterol, Total: 236 mg/dL — ABNORMAL HIGH (ref 100–199)
HDL: 30 mg/dL — AB (ref >39)
LDL Calculated: 145 mg/dL — ABNORMAL HIGH (ref 0–99)
TRIGLYCERIDES: 303 mg/dL — AB (ref 0–149)
VLDL Cholesterol Cal: 61 mg/dL — ABNORMAL HIGH (ref 5–40)

## 2016-11-04 LAB — HEMOGLOBIN A1C
Est. average glucose Bld gHb Est-mCnc: 169 mg/dL
Hgb A1c MFr Bld: 7.5 % — ABNORMAL HIGH (ref 4.8–5.6)

## 2016-11-26 LAB — LYME AB/WESTERN BLOT REFLEX
LYME DISEASE AB, QUANT, IGM: <0.8 index (ref 0.00–0.79)
Lyme IgG/IgM Ab: <0.91 {ISR} (ref 0.00–0.90)

## 2016-11-26 LAB — SPECIMEN STATUS REPORT

## 2016-12-03 ENCOUNTER — Ambulatory Visit (INDEPENDENT_AMBULATORY_CARE_PROVIDER_SITE_OTHER): Payer: Medicare HMO | Admitting: Family Medicine

## 2016-12-03 ENCOUNTER — Encounter: Payer: Self-pay | Admitting: Family Medicine

## 2016-12-03 VITALS — BP 120/76 | HR 78 | Temp 98.0°F | Resp 16 | Wt 210.5 lb

## 2016-12-03 DIAGNOSIS — F6381 Intermittent explosive disorder: Secondary | ICD-10-CM

## 2016-12-03 DIAGNOSIS — E538 Deficiency of other specified B group vitamins: Secondary | ICD-10-CM | POA: Diagnosis not present

## 2016-12-03 DIAGNOSIS — Z638 Other specified problems related to primary support group: Secondary | ICD-10-CM | POA: Diagnosis not present

## 2016-12-03 DIAGNOSIS — I10 Essential (primary) hypertension: Secondary | ICD-10-CM | POA: Diagnosis not present

## 2016-12-03 DIAGNOSIS — G4733 Obstructive sleep apnea (adult) (pediatric): Secondary | ICD-10-CM

## 2016-12-03 MED ORDER — CYANOCOBALAMIN 1000 MCG/ML IJ SOLN
1000.0000 ug | Freq: Once | INTRAMUSCULAR | Status: AC
  Administered 2016-12-03: 1000 ug via INTRAMUSCULAR

## 2016-12-03 NOTE — Assessment & Plan Note (Signed)
Supportive listening; he has list of private counselors in the area and continues to go to [place] for mental health

## 2016-12-03 NOTE — Assessment & Plan Note (Signed)
Patient is not appreciating significant improvement with the topamax; I am willing to prescribe a low-dose benzo if his psychiatrist agrees; note written on the AVS so he can use that to approach his psychiatrist; I am happy to talk with her about this; supportive listening provided

## 2016-12-03 NOTE — Progress Notes (Signed)
BP 120/76   Pulse 78   Temp 98 F (36.7 C) (Oral)   Resp 16   Wt 210 lb 8 oz (95.5 kg)   SpO2 97%   BMI 27.77 kg/m    Subjective:    Patient ID: Drew Morgan, male    DOB: Apr 14, 1940, 76 y.o.   MRN: [id number]  HPI: Drew Morgan is a 76 y.o. male  Chief Complaint  Patient presents with  . Follow-up    HPI He is here for f/u Wife is gone on vacation Thinking about going to [place] to witness and Best boy every two months but he says they don't do much there; he does a lot of self-help He feels like he has to help himself; he has lived in frustration his whole life Wife is not in counseling I gave him a list of [facility] counselors here, but he could not find one that takes his insurance [facility] [place] He just wants an out; not taking 75 mg BID; something bigger and he takes 1.5 pills of a larger dose BID He would like B12 shots, wants something to go out in his boat and have patience; patient's father had B12 deficiency Patient has been on CPAP for 20 years, but it gave him dry mouth; [facility], but it would wake him up; only sleeps two hours and had to wake back up again Sees psychiatrist at the [place], woman, Dr. Jonna Coup The topamax is not helping at all; has not tried benzo; does not drink alcohol [facility] manages his diabetes  Depression screen [facility] 2/9 11/03/2016 10/14/2016 09/11/2016 08/05/2016 07/01/2016  Decreased Interest 0 3 0 0 0  Down, Depressed, Hopeless 1 3 0 1 1  PHQ - 2 Score 1 6 0 1 1  Altered sleeping - 3 - - -  Tired, decreased energy - 3 - - -  Change in appetite - 1 - - -  Feeling bad or failure about yourself  - 1 - - -  Trouble concentrating - 1 - - -  Moving slowly or fidgety/restless - 1 - - -  Suicidal thoughts - 1 - - -  PHQ-9 Score - 17 - - -  Difficult doing work/chores - Somewhat difficult - - -    Relevant past medical, surgical, family and social history reviewed Past Medical History:  Diagnosis Date  . ADHD  (attention deficit hyperactivity disorder) 07/30/2015  . Anticoagulation goal of INR 2 to 3 07/30/2015  . Arthritis    shoulder, both knees, hip, back  . Coronary artery disease   . Depression   . Diabetes mellitus without complication (HCC)   . Factor V Leiden (HCC)   . Hyperlipidemia   . Hypertension   . Insomnia   . Intermittent explosive disorder 07/30/2015  . OSA (obstructive sleep apnea)   . Testicular dysfunction    Past Surgical History:  Procedure Laterality Date  . BACK SURGERY    . CORONARY ANGIOPLASTY WITH STENT PLACEMENT  1995  . HEMORRHOID SURGERY    . UVULOPLASTY     Family History  Problem Relation Age of Onset  . Anuerysm Father   . Depression Mother   . Depression Sister        taking venlafaxine  . Cancer Son        lung  . Heart attack Paternal Grandfather   . Depression Sister   . Stroke Brother   . Heart disease Neg Hx  Social History   Social History  . Marital status: Married    Spouse name: N/A  . Number of children: N/A  . Years of education: N/A   Occupational History  . Not on file.   Social History Main Topics  . Smoking status: Former Smoker    Types: Cigarettes    Quit date: 01/07/1991  . Smokeless tobacco: Former Neurosurgeon    Types: Snuff     Comment: social smoker when he did smoke  . Alcohol use No  . Drug use: No  . Sexual activity: No   Other Topics Concern  . Not on file   Social History Narrative  . No narrative on file    Interim medical history since last visit reviewed. Allergies and medications reviewed  Review of Systems Per HPI unless specifically indicated above     Objective:    BP 120/76   Pulse 78   Temp 98 F (36.7 C) (Oral)   Resp 16   Wt 210 lb 8 oz (95.5 kg)   SpO2 97%   BMI 27.77 kg/m   Wt Readings from Last 3 Encounters:  12/03/16 210 lb 8 oz (95.5 kg)  11/03/16 207 lb 11.2 oz (94.2 kg)  10/14/16 207 lb (93.9 kg)    Physical Exam  Constitutional: He appears well-developed and  well-nourished. No distress.  Eyes: No scleral icterus.  Cardiovascular: Normal rate.   Pulmonary/Chest: Effort normal. No respiratory distress.  Abdominal: He exhibits no distension.  Psychiatric: He has a normal mood and affect. His mood appears not anxious. He does not exhibit a depressed mood.  Good eye contact   Diabetic Foot Form - Detailed   Diabetic Foot Exam - detailed Diabetic Foot exam was performed with the following findings:  Yes 12/03/2016  9:59 AM  Visual Foot Exam completed.:  Yes  Pulse Foot Exam completed.:  Yes  Right Dorsalis Pedis:  Present Left Dorsalis Pedis:  Present  Sensory Foot Exam Completed.:  Yes Semmes-Weinstein Monofilament Test R Site 1-Great Toe:  Pos L Site 1-Great Toe:  Pos        Results for orders placed or performed in visit on 11/03/16  Hemoglobin A1c  Result Value Ref Range   Hgb A1c MFr Bld 7.5 (H) 4.8 - 5.6 %   Est. average glucose Bld gHb Est-mCnc 169 mg/dL  Lipid panel  Result Value Ref Range   Cholesterol, Total 236 (H) 100 - 199 mg/dL   Triglycerides 454 (H) 0 - 149 mg/dL   HDL 30 (L) >09 mg/dL   VLDL Cholesterol Cal 61 (H) 5 - 40 mg/dL   LDL Calculated 811 (H) 0 - 99 mg/dL   Chol/HDL Ratio 7.9 (H) 0.0 - 5.0 ratio  PSA  Result Value Ref Range   Prostate Specific Ag, Serum 1.9 0.0 - 4.0 ng/mL  CBC with Differential/Platelet  Result Value Ref Range   WBC 8.5 3.4 - 10.8 x10E3/uL   RBC 4.59 4.14 - 5.80 x10E6/uL   Hemoglobin 13.9 13.0 - 17.7 g/dL   Hematocrit 91.4 78.2 - 51.0 %   MCV 89 79 - 97 fL   MCH 30.3 26.6 - 33.0 pg   MCHC 34.2 31.5 - 35.7 g/dL   RDW 95.6 21.3 - 08.6 %   Platelets 304 150 - 379 x10E3/uL   Neutrophils 49 Not Estab. %   Lymphs 37 Not Estab. %   Monocytes 9 Not Estab. %   Eos 3 Not Estab. %   Basos 1 Not  Estab. %   Neutrophils Absolute 4.2 1.4 - 7.0 x10E3/uL   Lymphocytes Absolute 3.1 0.7 - 3.1 x10E3/uL   Monocytes Absolute 0.8 0.1 - 0.9 x10E3/uL   EOS (ABSOLUTE) 0.3 0.0 - 0.4 x10E3/uL   Basophils  Absolute 0.1 0.0 - 0.2 x10E3/uL   Immature Granulocytes 1 Not Estab. %   Immature Grans (Abs) 0.1 0.0 - 0.1 x10E3/uL  Comprehensive metabolic panel  Result Value Ref Range   Glucose 116 (H) 65 - 99 mg/dL   BUN 24 8 - 27 mg/dL   Creatinine, Ser 1.19 (H) 0.76 - 1.27 mg/dL   GFR calc non Af Amer 51 (L) >59 mL/min/1.73   GFR calc Af Amer 59 (L) >59 mL/min/1.73   BUN/Creatinine Ratio 18 10 - 24   Sodium 143 134 - 144 mmol/L   Potassium 4.9 3.5 - 5.2 mmol/L   Chloride 107 (H) 96 - 106 mmol/L   CO2 18 (L) 20 - 29 mmol/L   Calcium 9.7 8.6 - 10.2 mg/dL   Total Protein 7.5 6.0 - 8.5 g/dL   Albumin 4.6 3.5 - 4.8 g/dL   Globulin, Total 2.9 1.5 - 4.5 g/dL   Albumin/Globulin Ratio 1.6 1.2 - 2.2   Bilirubin Total 0.8 0.0 - 1.2 mg/dL   Alkaline Phosphatase 68 39 - 117 IU/L   AST 30 0 - 40 IU/L   ALT 21 0 - 44 IU/L  Lyme Ab/Western Blot Reflex  Result Value Ref Range   Lyme IgG/IgM Ab <0.91 0.00 - 0.90 ISR   LYME DISEASE AB, QUANT, IGM <0.80 0.00 - 0.79 index  Specimen status report  Result Value Ref Range   specimen status report Comment   POCT occult blood stool  Result Value Ref Range   Fecal Occult Blood, POC Negative Negative   Card #1 Date [id number]    Card #2 Fecal Occult Blod, POC     Card #2 Date     Card #3 Fecal Occult Blood, POC     Card #3 Date        Assessment & Plan:   Problem List Items Addressed This Visit      Cardiovascular and Mediastinum   Essential hypertension (Chronic)    Well-controlled today        Respiratory   OSA (obstructive sleep apnea)    Not using the mask at all, wakes him up every hour; offered help, but he declined        Other   Stress due to family tension    Supportive listening; he has list of private counselors in the area and continues to go to [place] for mental health      Intermittent explosive disorder - Primary (Chronic)    Patient is not appreciating significant improvement with the topamax; I am willing to prescribe a low-dose  benzo if his psychiatrist agrees; note written on the AVS so he can use that to approach his psychiatrist; I am happy to talk with her about this; supportive listening provided       Other Visit Diagnoses    Deficiency of vitamin B12       Relevant Medications   cyanocobalamin ((VITAMIN B-12)) injection 1,000 mcg (Completed)       Follow up plan: Return in about 1 month (around 01/02/2017).  An after-visit summary was printed and given to the patient at check-out.  Please see the patient instructions which may contain other information and recommendations beyond what is mentioned above in the assessment and plan.  Meds ordered this encounter  Medications  . cyanocobalamin ((VITAMIN B-12)) injection 1,000 mcg    No orders of the defined types were placed in this encounter.   Face-to-face time with patient was more than 25 minutes, >50% time spent counseling and coordination of care

## 2016-12-03 NOTE — Assessment & Plan Note (Signed)
Well controlled today.

## 2016-12-03 NOTE — Assessment & Plan Note (Signed)
Not using the mask at all, wakes him up every hour; offered help, but he declined

## 2016-12-03 NOTE — Patient Instructions (Addendum)
Please let you psychiatrist know that I think a benzodiazepine would be beneficial in your particular case If she is not wanting to prescribe and monitor this, but is willing to let you take it, I am certainly willing to prescribe it She is welcome to call me to discuss this at [phone]  12 Ways to Curb Anxiety  ?Anxiety is normal human sensation. It is what helped our ancestors survive the pitfalls of the wilderness. Anxiety is defined as experiencing worry or nervousness about an imminent event or something with an uncertain outcome. It is a feeling experienced by most people at some point in their lives. Anxiety can be triggered by a very personal issue, such as the illness of a loved one, or an event of global proportions, such as a refugee crisis. Some of the symptoms of anxiety are:  Feeling restless.  Having a feeling of impending danger.  Increased heart rate.  Rapid breathing. Sweating.  Shaking.  Weakness or feeling tired.  Difficulty concentrating on anything except the current worry.  Insomnia.  Stomach or bowel problems. What can we do about anxiety we may be feeling? There are many techniques to help manage stress and relax. Here are 12 ways you can reduce your anxiety almost immediately: 1. Turn off the constant feed of information. Take a social media sabbatical. Studies have shown that social media directly contributes to social anxiety.  2. Monitor your television viewing habits. Are you watching shows that are also contributing to your anxiety, such as 24-hour news stations? Try watching something else, or better yet, nothing at all. Instead, listen to music, read an inspirational book or practice a hobby. 3. Eat nutritious meals. Also, don't skip meals and keep healthful snacks on hand. Hunger and poor diet contributes to feeling anxious. 4. Sleep. Sleeping on a regular schedule for at least seven to eight hours a night will do wonders for your outlook when you are  awake. 5. Exercise. Regular exercise will help rid your body of that anxious energy and help you get more restful sleep. 6. Try deep (diaphragmatic) breathing. Inhale slowly through your nose for five seconds and exhale through your mouth. 7. Practice acceptance and gratitude. When anxiety hits, accept that there are things out of your control that shouldn't be of immediate concern.  8. Seek out humor. When anxiety strikes, watch a funny video, read jokes or call a friend who makes you laugh. Laughter is healing for our bodies and releases endorphins that are calming. 9. Stay positive. Take the effort to replace negative thoughts with positive ones. Try to see a stressful situation in a positive light. Try to come up with solutions rather than dwelling on the problem. 10. Figure out what triggers your anxiety. Keep a journal and make note of anxious moments and the events surrounding them. This will help you identify triggers you can avoid or even eliminate. 11. Talk to someone. Let a trusted friend, family member or even trained professional know that you are feeling overwhelmed and anxious. Verbalize what you are feeling and why.  12. Volunteer. If your anxiety is triggered by a crisis on a large scale, become an advocate and work to resolve the problem that is causing you unease. Anxiety is often unwelcome and can become overwhelming. If not kept in check, it can become a disorder that could require medical treatment. However, if you take the time to care for yourself and avoid the triggers that make you anxious, you will be able  to find moments of relaxation and clarity that make your life much more enjoyable.

## 2016-12-05 ENCOUNTER — Telehealth: Payer: Self-pay | Admitting: Family Medicine

## 2016-12-05 NOTE — Telephone Encounter (Signed)
Late entry: I called and left message for patient yesterday, wanting to find out if he had talked to his psychiatrist He has had years of problems "I'm okay" "I'm not suicidal, I'm not over the edge" He promises to seek help and come see me if any thoughts of self-harm; would never do anything to hurt any one Has his faith I am happy to contact his psychiatrist or counselor on his behalf If he has not heard back from his [place] doctor by early next week, please call me Discussed option of trying ativan

## 2016-12-10 ENCOUNTER — Encounter: Payer: Self-pay | Admitting: Family Medicine

## 2016-12-11 ENCOUNTER — Telehealth: Payer: Self-pay

## 2016-12-11 NOTE — Telephone Encounter (Signed)
I am so glad to hear this; thank you

## 2016-12-11 NOTE — Telephone Encounter (Signed)
Patient called back states returning your call from last week?

## 2016-12-11 NOTE — Telephone Encounter (Signed)
Great; please find out if he has spoken with his psychiatrist If not, I'll be glad to call her; we'll need a signed paper before I do that gives me permission to talk about him before she'll even talk to me, though I'd love to get him started on medicine We'll need name/contact info of psych at [facility] please

## 2016-12-11 NOTE — Telephone Encounter (Signed)
Patient states since stopping med last week for depression, he has felt so much better and has had the best past recent days that he has ever had in the past 4 years.  He states right now does not want any medicine at this time.

## 2016-12-17 ENCOUNTER — Telehealth: Payer: Self-pay | Admitting: Family Medicine

## 2016-12-17 NOTE — Telephone Encounter (Signed)
He called to give a praise report He stopped the topiramate; he read about the side effects; he had been taking for several months; he stopped it; he feels great now He says his attitude is great He appreciates the prayers The B12 helped too He talked to his psychiatrist [facility], doing all sorts of things He just wanted me to know how good he feels

## 2017-01-02 ENCOUNTER — Ambulatory Visit (INDEPENDENT_AMBULATORY_CARE_PROVIDER_SITE_OTHER): Payer: Medicare HMO | Admitting: Family Medicine

## 2017-01-02 ENCOUNTER — Encounter: Payer: Self-pay | Admitting: Family Medicine

## 2017-01-02 VITALS — BP 124/62 | HR 82 | Temp 97.9°F | Resp 16 | Wt 214.6 lb

## 2017-01-02 DIAGNOSIS — E538 Deficiency of other specified B group vitamins: Secondary | ICD-10-CM | POA: Diagnosis not present

## 2017-01-02 DIAGNOSIS — L03119 Cellulitis of unspecified part of limb: Secondary | ICD-10-CM

## 2017-01-02 DIAGNOSIS — F6381 Intermittent explosive disorder: Secondary | ICD-10-CM | POA: Diagnosis not present

## 2017-01-02 MED ORDER — MUPIROCIN CALCIUM 2 % EX CREA: 1.0000 "application " | TOPICAL_CREAM | Freq: Two times a day (BID) | CUTANEOUS | 0 refills | Status: DC

## 2017-01-02 MED ORDER — CYANOCOBALAMIN 1000 MCG/ML IJ SOLN
1000.0000 ug | Freq: Once | INTRAMUSCULAR | Status: AC
  Administered 2017-01-02: 1000 ug via INTRAMUSCULAR

## 2017-01-02 NOTE — Assessment & Plan Note (Signed)
Patient reports to be doing much better; continue to work with psychiatrist

## 2017-01-02 NOTE — Patient Instructions (Addendum)
2 Cor. 1:3-4 Praise to the God of All Comfort 3 Praise be to the God and Father of our Country[employer] Financial[employer] Jesus Christ, the Father of compassion and the God of all comfort,  4 who comforts us in all our troubles, so that we can comfort those in any trouble with the comfort we ourselves receive from God.   [facility] We'll continue the montthly vitamin B12 shots Talk to the [facility] or your pharmacist about the new shingles vaccine (Shingrix)

## 2017-01-02 NOTE — Progress Notes (Signed)
BP 124/62   Pulse 82   Temp 97.9 F (36.6 C) (Oral)   Resp 16   Wt 214 lb 9.6 oz (97.3 kg)   SpO2 97%   BMI 28.31 kg/m    Subjective:    Patient ID: Drew Morgan, male    DOB: 10-Dec-1940, 76 y.o.   MRN: [id number]  HPI: TIMO HARTWIG is a 76 y.o. male  Chief Complaint  Patient presents with  . Follow-up    HPI  Patient is here for follow-up Since his last visit, he stopped taking his psychiatric medicines He feels so much better; he has "no depression" now The B12 has really helped; he is out doing stuff Attitude has changed completely Not explosive now See phone notes PHQ sores are much improved Blood thinner Xarelto; no bleeding from the gums, nose, or in urine or stool; has hemorrhoids from time to time, just a little fresh blood Went to the [place] on Tuesday and saw them there to monitor Xarelto, kidneys, liver Has a sore on the dorsum of the left foot; gets poked from time to time from working in the yard; does not think he has anything in there, no drainage; no fevers Diabetes has been doing well  Depression screen [facility] 2/9 01/02/2017 11/03/2016 10/14/2016 09/11/2016 08/05/2016  Decreased Interest 0 0 3 0 0  Down, Depressed, Hopeless 1 1 3  0 1  PHQ - 2 Score 1 1 6  0 1  Altered sleeping - - 3 - -  Tired, decreased energy - - 3 - -  Change in appetite - - 1 - -  Feeling bad or failure about yourself  - - 1 - -  Trouble concentrating - - 1 - -  Moving slowly or fidgety/restless - - 1 - -  Suicidal thoughts - - 1 - -  PHQ-9 Score - - 17 - -  Difficult doing work/chores - - Somewhat difficult - -   Relevant past medical, surgical, family and social history reviewed Past Medical History:  Diagnosis Date  . ADHD (attention deficit hyperactivity disorder) 07/30/2015  . Anticoagulation goal of INR 2 to 3 07/30/2015  . Arthritis    shoulder, both knees, hip, back  . Coronary artery disease   . Depression   . Diabetes mellitus without complication (HCC)   . Factor V  Leiden (HCC)   . History of shingles   . Hyperlipidemia   . Hypertension   . Insomnia   . Intermittent explosive disorder 07/30/2015  . OSA (obstructive sleep apnea)   . Testicular dysfunction    Past Surgical History:  Procedure Laterality Date  . BACK SURGERY    . CORONARY ANGIOPLASTY WITH STENT PLACEMENT  1995  . HEMORRHOID SURGERY    . UVULOPLASTY     Family History  Problem Relation Age of Onset  . Anuerysm Father   . Depression Mother   . Clotting disorder Mother   . Depression Sister        taking venlafaxine  . Cancer Son        lung  . Heart attack Paternal Grandfather   . Depression Sister   . Stroke Brother   . Heart disease Neg Hx    Social History   Social History  . Marital status: Married    Spouse name: N/A  . Number of children: N/A  . Years of education: N/A   Occupational History  . Not on file.   Social History Main Topics  .  Smoking status: Former Smoker    Types: Cigarettes    Quit date: 01/07/1991  . Smokeless tobacco: Former NeurosurgeonUser    Types: Chew     Comment: social smoker when he did smoke  . Alcohol use No  . Drug use: No  . Sexual activity: No   Other Topics Concern  . Not on file   Social History Narrative  . No narrative on file   Interim medical history since last visit reviewed. Allergies and medications reviewed  Review of Systems Per HPI unless specifically indicated above     Objective:    BP 124/62   Pulse 82   Temp 97.9 F (36.6 C) (Oral)   Resp 16   Wt 214 lb 9.6 oz (97.3 kg)   SpO2 97%   BMI 28.31 kg/m   Wt Readings from Last 3 Encounters:  01/02/17 214 lb 9.6 oz (97.3 kg)  12/03/16 210 lb 8 oz (95.5 kg)  11/03/16 207 lb 11.2 oz (94.2 kg)    Physical Exam  Constitutional: He appears well-developed and well-nourished. No distress.  Eyes: No scleral icterus.  Cardiovascular: Normal rate and regular rhythm.   Pulmonary/Chest: Effort normal and breath sounds normal. No respiratory distress.    Abdominal: He exhibits no distension.  Musculoskeletal: He exhibits no edema.  Skin:     Erythematous lesion on the dorsum of the LEFT foot; no drainage  Psychiatric: He has a normal mood and affect. His behavior is normal. His mood appears not anxious. He does not exhibit a depressed mood.  Good eye contact   Diabetic Foot Form - Detailed   Diabetic Foot Exam - detailed Diabetic Foot exam was performed with the following findings:  Yes 01/02/2017  1:17 PM  Visual Foot Exam completed.:  Yes  Pulse Foot Exam completed.:  Yes  Right Dorsalis Pedis:  Present Left Dorsalis Pedis:  Present  Sensory Foot Exam Completed.:  Yes Semmes-Weinstein Monofilament Test R Site 1-Great Toe:  Pos L Site 1-Great Toe:  Pos        Results for orders placed or performed in visit on 11/03/16  Hemoglobin A1c  Result Value Ref Range   Hgb A1c MFr Bld 7.5 (H) 4.8 - 5.6 %   Est. average glucose Bld gHb Est-mCnc 169 mg/dL  Lipid panel  Result Value Ref Range   Cholesterol, Total 236 (H) 100 - 199 mg/dL   Triglycerides 387303 (H) 0 - 149 mg/dL   HDL 30 (L) >56>39 mg/dL   VLDL Cholesterol Cal 61 (H) 5 - 40 mg/dL   LDL Calculated 433145 (H) 0 - 99 mg/dL   Chol/HDL Ratio 7.9 (H) 0.0 - 5.0 ratio  PSA  Result Value Ref Range   Prostate Specific Ag, Serum 1.9 0.0 - 4.0 ng/mL  CBC with Differential/Platelet  Result Value Ref Range   WBC 8.5 3.4 - 10.8 x10E3/uL   RBC 4.59 4.14 - 5.80 x10E6/uL   Hemoglobin 13.9 13.0 - 17.7 g/dL   Hematocrit 29.540.6 18.837.5 - 51.0 %   MCV 89 79 - 97 fL   MCH 30.3 26.6 - 33.0 pg   MCHC 34.2 31.5 - 35.7 g/dL   RDW 41.613.6 60.612.3 - 30.115.4 %   Platelets 304 150 - 379 x10E3/uL   Neutrophils 49 Not Estab. %   Lymphs 37 Not Estab. %   Monocytes 9 Not Estab. %   Eos 3 Not Estab. %   Basos 1 Not Estab. %   Neutrophils Absolute 4.2 1.4 - 7.0  x10E3/uL   Lymphocytes Absolute 3.1 0.7 - 3.1 x10E3/uL   Monocytes Absolute 0.8 0.1 - 0.9 x10E3/uL   EOS (ABSOLUTE) 0.3 0.0 - 0.4 x10E3/uL   Basophils  Absolute 0.1 0.0 - 0.2 x10E3/uL   Immature Granulocytes 1 Not Estab. %   Immature Grans (Abs) 0.1 0.0 - 0.1 x10E3/uL  Comprehensive metabolic panel  Result Value Ref Range   Glucose 116 (H) 65 - 99 mg/dL   BUN 24 8 - 27 mg/dL   Creatinine, Ser 1.61 (H) 0.76 - 1.27 mg/dL   GFR calc non Af Amer 51 (L) >59 mL/min/1.73   GFR calc Af Amer 59 (L) >59 mL/min/1.73   BUN/Creatinine Ratio 18 10 - 24   Sodium 143 134 - 144 mmol/L   Potassium 4.9 3.5 - 5.2 mmol/L   Chloride 107 (H) 96 - 106 mmol/L   CO2 18 (L) 20 - 29 mmol/L   Calcium 9.7 8.6 - 10.2 mg/dL   Total Protein 7.5 6.0 - 8.5 g/dL   Albumin 4.6 3.5 - 4.8 g/dL   Globulin, Total 2.9 1.5 - 4.5 g/dL   Albumin/Globulin Ratio 1.6 1.2 - 2.2   Bilirubin Total 0.8 0.0 - 1.2 mg/dL   Alkaline Phosphatase 68 39 - 117 IU/L   AST 30 0 - 40 IU/L   ALT 21 0 - 44 IU/L  Lyme Ab/Western Blot Reflex  Result Value Ref Range   Lyme IgG/IgM Ab <0.91 0.00 - 0.90 ISR   LYME DISEASE AB, QUANT, IGM <0.80 0.00 - 0.79 index  Specimen status report  Result Value Ref Range   specimen status report Comment   POCT occult blood stool  Result Value Ref Range   Fecal Occult Blood, POC Negative Negative   Card #1 Date [id number]    Card #2 Fecal Occult Blod, POC     Card #2 Date     Card #3 Fecal Occult Blood, POC     Card #3 Date        Assessment & Plan:   Problem List Items Addressed This Visit      Other   Intermittent explosive disorder (Chronic)    Patient reports to be doing much better; continue to work with psychiatrist      B12 deficiency   Relevant Medications   cyanocobalamin ((VITAMIN B-12)) injection 1,000 mcg (Completed)    Other Visit Diagnoses    Cellulitis of foot    -  Primary   keep a close eye on the foot; use bactroban or other antibiotic ointment if bactroban too $$$; watch for red streaks; call if needed      Follow up plan: Return in about 1 month (around 02/02/2017) for follow-up visit with Dr. Sherie Don.  An after-visit  summary was printed and given to the patient at check-out.  Please see the patient instructions which may contain other information and recommendations beyond what is mentioned above in the assessment and plan.  Meds ordered this encounter  Medications  . cyanocobalamin ((VITAMIN B-12)) injection 1,000 mcg  . mupirocin cream (BACTROBAN) 2 %    Sig: Apply 1 application topically 2 (two) times daily.    Dispense:  15 g    Refill:  0    Pharmacist -- okay to use the CREAM OR OINTMENT, fine to substitute    No orders of the defined types were placed in this encounter.

## 2017-02-02 ENCOUNTER — Encounter: Payer: Self-pay | Admitting: Family Medicine

## 2017-02-02 ENCOUNTER — Ambulatory Visit: Payer: Medicare HMO | Admitting: Family Medicine

## 2017-02-02 VITALS — BP 122/78 | HR 81 | Temp 98.1°F | Ht 73.0 in | Wt 214.8 lb

## 2017-02-02 DIAGNOSIS — I10 Essential (primary) hypertension: Secondary | ICD-10-CM

## 2017-02-02 DIAGNOSIS — E538 Deficiency of other specified B group vitamins: Secondary | ICD-10-CM | POA: Diagnosis not present

## 2017-02-02 DIAGNOSIS — D6851 Activated protein C resistance: Secondary | ICD-10-CM

## 2017-02-02 DIAGNOSIS — M170 Bilateral primary osteoarthritis of knee: Secondary | ICD-10-CM

## 2017-02-02 DIAGNOSIS — E1121 Type 2 diabetes mellitus with diabetic nephropathy: Secondary | ICD-10-CM | POA: Diagnosis not present

## 2017-02-02 DIAGNOSIS — E782 Mixed hyperlipidemia: Secondary | ICD-10-CM | POA: Diagnosis not present

## 2017-02-02 MED ORDER — CYANOCOBALAMIN 1000 MCG/ML IJ SOLN
1000.0000 ug | Freq: Once | INTRAMUSCULAR | Status: AC
  Administered 2017-02-02: 1000 ug via INTRAMUSCULAR

## 2017-02-02 MED ORDER — LISINOPRIL 40 MG PO TABS: 40.0000 mg | ORAL_TABLET | Freq: Every day | ORAL | 3 refills | Status: DC

## 2017-02-02 MED ORDER — ATENOLOL 50 MG PO TABS: 50.0000 mg | ORAL_TABLET | Freq: Two times a day (BID) | ORAL | 3 refills | Status: DC

## 2017-02-02 MED ORDER — AMLODIPINE BESYLATE 5 MG PO TABS: 5.0000 mg | ORAL_TABLET | Freq: Every day | ORAL | 3 refills | Status: DC

## 2017-02-02 NOTE — Assessment & Plan Note (Signed)
Foot exam by MD; [facility]

## 2017-02-02 NOTE — Assessment & Plan Note (Signed)
Continue the medicine

## 2017-02-02 NOTE — Assessment & Plan Note (Signed)
Not using the voltaren; try tumeric

## 2017-02-02 NOTE — Patient Instructions (Signed)
Continue the monthly vitamin B12 shots

## 2017-02-02 NOTE — Progress Notes (Signed)
BP 122/78 (BP Location: Right Arm, Patient Position: Sitting, Cuff Size: Large)   Pulse 81   Temp 98.1 F (36.7 C) (Oral)   Ht 6\' 1"  (1.854 m)   Wt 214 lb 12.8 oz (97.4 kg)   SpO2 92%   BMI 28.34 kg/m    Subjective:    Patient ID: Drew Morgan, male    DOB: 11/16/1940, 76 y.o.   MRN: [id number]  HPI: Drew Morgan is a 76 y.o. male  Chief Complaint  Patient presents with  . Follow-up    B12 is working well for him, denies complaints     HPI Patient is here for follow-up  He is feeling the best he has in a long time The B12 is helping He is due for a shot He has diabetes type 2, seeing [facility] [place] for that and for his labs; thinks A1c was 7.8 "It would be okay if I would do right" He is his own worst enemy at this time of year with snacking and treats 123-146 glucose range He has a physical at the [place] next month; they will check his testosterone and magnesium He is off metformin and PPI No loose stools Last visit, he had a spot of cellulitis on the dorsum of the left foot; treated with antibiotic cream; healed up; not bothering him now  [facility] 2[facility] 02/02/2017 01/02/2017 11/03/2016 10/14/2016 09/11/2016  Decreased Interest 0 0 0 3 0  Down, Depressed, Hopeless 0 1 1 3  0  PHQ - 2 Score 0 1 1 6  0  Altered sleeping - - - 3 -  Tired, decreased energy - - - 3 -  Change in appetite - - - 1 -  Feeling bad or failure about yourself  - - - 1 -  Trouble concentrating - - - 1 -  Moving slowly or fidgety/restless - - - 1 -  Suicidal thoughts - - - 1 -  PHQ-9 Score - - - 17 -  Difficult doing work/chores - - - Somewhat difficult -    Relevant past medical, surgical, family and social history reviewed Past Medical History:  Diagnosis Date  . ADHD (attention deficit hyperactivity disorder) 07/30/2015  . Anticoagulation goal of INR 2 to 3 07/30/2015  . Arthritis    shoulder, both knees, hip, back  . Coronary artery disease   . Depression   . Diabetes mellitus  without complication (HCC)   . Factor V Leiden (HCC)   . History of shingles   . Hyperlipidemia   . Hypertension   . Insomnia   . Intermittent explosive disorder 07/30/2015  . OSA (obstructive sleep apnea)   . Testicular dysfunction    Past Surgical History:  Procedure Laterality Date  . BACK SURGERY    . CORONARY ANGIOPLASTY WITH STENT PLACEMENT  1995  . HEMORRHOID SURGERY    . UVULOPLASTY     Family History  Problem Relation Age of Onset  . Anuerysm Father   . Depression Mother   . Clotting disorder Mother   . Depression Sister        taking venlafaxine  . Cancer Son        lung  . Heart attack Paternal Grandfather   . Depression Sister   . Stroke Brother   . Heart disease Neg Hx    Social History   Tobacco Use  . Smoking status: Former Smoker    Types: Cigarettes    Last attempt to quit: 01/07/1991  Years since quitting: 26.0  . Smokeless tobacco: Former NeurosurgeonUser    Types: Chew  . Tobacco comment: social smoker when he did smoke  Substance Use Topics  . Alcohol use: No    Alcohol/week: 0.0 oz  . Drug use: No    Interim medical history since last visit reviewed. Allergies and medications reviewed  Review of Systems  Constitutional: Negative for unexpected weight change.  Respiratory: Negative for shortness of breath.   Cardiovascular: Negative for chest pain.   Per HPI unless specifically indicated above     Objective:    BP 122/78 (BP Location: Right Arm, Patient Position: Sitting, Cuff Size: Large)   Pulse 81   Temp 98.1 F (36.7 C) (Oral)   Ht 6\' 1"  (1.854 m)   Wt 214 lb 12.8 oz (97.4 kg)   SpO2 92%   BMI 28.34 kg/m   Wt Readings from Last 3 Encounters:  02/02/17 214 lb 12.8 oz (97.4 kg)  01/02/17 214 lb 9.6 oz (97.3 kg)  12/03/16 210 lb 8 oz (95.5 kg)    Physical Exam  Constitutional: He appears well-developed and well-nourished. No distress.  Eyes: No scleral icterus.  Cardiovascular: Normal rate.  Pulmonary/Chest: Effort normal. No  respiratory distress.  Abdominal: He exhibits no distension.  Musculoskeletal: He exhibits no edema.  Neurological: He is alert.  Psychiatric: He has a normal mood and affect. His behavior is normal. His mood appears not anxious. He does not exhibit a depressed mood.  Good eye contact   Diabetic Foot Form - Detailed   Diabetic Foot Exam - detailed Diabetic Foot exam was performed with the following findings:  Yes 02/02/2017  9:36 AM  Visual Foot Exam completed.:  Yes  Pulse Foot Exam completed.:  Yes  Right Dorsalis Pedis:  Present Left Dorsalis Pedis:  Present  Sensory Foot Exam Completed.:  Yes Semmes-Weinstein Monofilament Test R Site 1-Great Toe:  Pos L Site 1-Great Toe:  Pos        Results for orders placed or performed in visit on 11/03/16  Hemoglobin A1c  Result Value Ref Range   Hgb A1c MFr Bld 7.5 (H) 4.8 - 5.6 %   Est. average glucose Bld gHb Est-mCnc 169 mg/dL  Lipid panel  Result Value Ref Range   Cholesterol, Total 236 (H) 100 - 199 mg/dL   Triglycerides 161303 (H) 0 - 149 mg/dL   HDL 30 (L) >09>39 mg/dL   VLDL Cholesterol Cal 61 (H) 5 - 40 mg/dL   LDL Calculated 604145 (H) 0 - 99 mg/dL   Chol/HDL Ratio 7.9 (H) 0.0 - 5.0 ratio  PSA  Result Value Ref Range   Prostate Specific Ag, Serum 1.9 0.0 - 4.0 ng/mL  CBC with Differential/Platelet  Result Value Ref Range   WBC 8.5 3.4 - 10.8 x10E3/uL   RBC 4.59 4.14 - 5.80 x10E6/uL   Hemoglobin 13.9 13.0 - 17.7 g/dL   Hematocrit 54.040.6 98.137.5 - 51.0 %   MCV 89 79 - 97 fL   MCH 30.3 26.6 - 33.0 pg   MCHC 34.2 31.5 - 35.7 g/dL   RDW 19.113.6 47.812.3 - 29.515.4 %   Platelets 304 150 - 379 x10E3/uL   Neutrophils 49 Not Estab. %   Lymphs 37 Not Estab. %   Monocytes 9 Not Estab. %   Eos 3 Not Estab. %   Basos 1 Not Estab. %   Neutrophils Absolute 4.2 1.4 - 7.0 x10E3/uL   Lymphocytes Absolute 3.1 0.7 - 3.1 x10E3/uL  Monocytes Absolute 0.8 0.1 - 0.9 x10E3/uL   EOS (ABSOLUTE) 0.3 0.0 - 0.4 x10E3/uL   Basophils Absolute 0.1 0.0 - 0.2 x10E3/uL    Immature Granulocytes 1 Not Estab. %   Immature Grans (Abs) 0.1 0.0 - 0.1 x10E3/uL  Comprehensive metabolic panel  Result Value Ref Range   Glucose 116 (H) 65 - 99 mg/dL   BUN 24 8 - 27 mg/dL   Creatinine, Ser 5.40 (H) 0.76 - 1.27 mg/dL   GFR calc non Af Amer 51 (L) >59 mL/min/1.73   GFR calc Af Amer 59 (L) >59 mL/min/1.73   BUN/Creatinine Ratio 18 10 - 24   Sodium 143 134 - 144 mmol/L   Potassium 4.9 3.5 - 5.2 mmol/L   Chloride 107 (H) 96 - 106 mmol/L   CO2 18 (L) 20 - 29 mmol/L   Calcium 9.7 8.6 - 10.2 mg/dL   Total Protein 7.5 6.0 - 8.5 g/dL   Albumin 4.6 3.5 - 4.8 g/dL   Globulin, Total 2.9 1.5 - 4.5 g/dL   Albumin/Globulin Ratio 1.6 1.2 - 2.2   Bilirubin Total 0.8 0.0 - 1.2 mg/dL   Alkaline Phosphatase 68 39 - 117 IU/L   AST 30 0 - 40 IU/L   ALT 21 0 - 44 IU/L  Lyme Ab/Western Blot Reflex  Result Value Ref Range   Lyme IgG/IgM Ab <0.91 0.00 - 0.90 ISR   LYME DISEASE AB, QUANT, IGM <0.80 0.00 - 0.79 index  Specimen status report  Result Value Ref Range   specimen status report Comment   POCT occult blood stool  Result Value Ref Range   Fecal Occult Blood, POC Negative Negative   Card #1 Date [id number]    Card #2 Fecal Occult Blod, POC     Card #2 Date     Card #3 Fecal Occult Blood, POC     Card #3 Date        Assessment & Plan:   Problem List Items Addressed This Visit      Cardiovascular and Mediastinum   Essential hypertension (Chronic)    Continue the medicine      Relevant Medications   atenolol (TENORMIN) 50 MG tablet   amLODipine (NORVASC) 5 MG tablet   lisinopril (PRINIVIL,ZESTRIL) 40 MG tablet     Endocrine   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)    Foot exam by MD; A1c done at the [facility]      Relevant Medications   lisinopril (PRINIVIL,ZESTRIL) 40 MG tablet     Musculoskeletal and Integument   Arthritis of both knees    Not using the voltaren; try tumeric        Hematopoietic and Hemostatic    Factor V Leiden (HCC)    On Factor Xa inhibitor        Other   Hyperlipidemia (Chronic)   Relevant Medications   atenolol (TENORMIN) 50 MG tablet   amLODipine (NORVASC) 5 MG tablet   lisinopril (PRINIVIL,ZESTRIL) 40 MG tablet   B12 deficiency - Primary   Relevant Medications   cyanocobalamin ((VITAMIN B-12)) injection 1,000 mcg (Completed)       Follow up plan: Return in about 1 month (around 03/04/2017).  An after-visit summary was printed and given to the patient at check-out.  Please see the patient instructions which may contain other information and recommendations beyond what is mentioned above in the assessment and plan.  Meds ordered this encounter  Medications  . atenolol (TENORMIN) 50 MG tablet  Sig: Take 1 tablet (50 mg total) by mouth 2 (two) times daily.    Dispense:  180 tablet    Refill:  3  . amLODipine (NORVASC) 5 MG tablet    Sig: Take 1 tablet (5 mg total) by mouth daily.    Dispense:  90 tablet    Refill:  3  . lisinopril (PRINIVIL,ZESTRIL) 40 MG tablet    Sig: Take 1 tablet (40 mg total) by mouth daily.    Dispense:  90 tablet    Refill:  3  . cyanocobalamin ((VITAMIN B-12)) injection 1,000 mcg    No orders of the defined types were placed in this encounter.

## 2017-02-02 NOTE — Assessment & Plan Note (Signed)
On Factor Xa inhibitor 

## 2017-02-25 LAB — HEMOGLOBIN A1C: Hemoglobin A1C: 7.5

## 2017-02-25 LAB — LIPID PANEL
Cholesterol: 245 — AB (ref 0–200)
HDL: 37 (ref 35–70)
LDL Cholesterol: 142
Triglycerides: 332 — AB (ref 40–160)

## 2017-02-25 LAB — BASIC METABOLIC PANEL
Creatinine: 1 (ref 0.6–1.3)
GLUCOSE: 106
Sodium: 142 (ref 137–147)

## 2017-02-25 LAB — HEPATIC FUNCTION PANEL
ALT: 36 (ref 10–40)
AST: 44 — AB (ref 14–40)

## 2017-03-06 ENCOUNTER — Encounter: Payer: Self-pay | Admitting: Family Medicine

## 2017-03-06 ENCOUNTER — Ambulatory Visit (INDEPENDENT_AMBULATORY_CARE_PROVIDER_SITE_OTHER): Payer: Medicare HMO | Admitting: Family Medicine

## 2017-03-06 DIAGNOSIS — E1140 Type 2 diabetes mellitus with diabetic neuropathy, unspecified: Secondary | ICD-10-CM

## 2017-03-06 DIAGNOSIS — E538 Deficiency of other specified B group vitamins: Secondary | ICD-10-CM | POA: Diagnosis not present

## 2017-03-06 DIAGNOSIS — F6381 Intermittent explosive disorder: Secondary | ICD-10-CM

## 2017-03-06 DIAGNOSIS — E1165 Type 2 diabetes mellitus with hyperglycemia: Secondary | ICD-10-CM

## 2017-03-06 DIAGNOSIS — I10 Essential (primary) hypertension: Secondary | ICD-10-CM | POA: Diagnosis not present

## 2017-03-06 DIAGNOSIS — IMO0002 Reserved for concepts with insufficient information to code with codable children: Secondary | ICD-10-CM

## 2017-03-06 MED ORDER — CYANOCOBALAMIN 1000 MCG/ML IJ SOLN
1000.0000 ug | Freq: Once | INTRAMUSCULAR | Status: AC
  Administered 2017-03-06: 1000 ug via INTRAMUSCULAR

## 2017-03-06 NOTE — Assessment & Plan Note (Signed)
Supportive listening; patient reports doing well these last several months

## 2017-03-06 NOTE — Assessment & Plan Note (Signed)
Monthly B12 injections. 

## 2017-03-06 NOTE — Progress Notes (Signed)
BP 118/74 (BP Location: Right Arm, Patient Position: Sitting, Cuff Size: Large)   Pulse 89   Temp 98 F (36.7 C) (Oral)   Ht 6\' 1"  (1.854 m)   Wt 216 lb 9.6 oz (98.2 kg)   SpO2 99%   BMI 28.58 kg/m    Subjective:    Patient ID: Drew Morgan, male    DOB: 11/25/1940, 76 y.o.   MRN: [id number]  HPI: Drew Morgan is a 76 y.o. male  Chief Complaint  Patient presents with  . 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 Dry skin on the feet; using olive oil Still dealing with feelings of isolation and rejection; however, this is the best he has felt for years and he thinks the B12 shots are really helping  [facility] 2[facility] 03/06/2017 02/02/2017 01/02/2017 11/03/2016 10/14/2016  Decreased Interest 0 0 0 0 3  Down, Depressed, Hopeless 0 0 1 1 3   PHQ - 2 Score 0 0 1 1 6   Altered sleeping - - - - 3  Tired, decreased energy - - - - 3  Change in appetite - - - - 1  Feeling bad or failure about yourself  - - - - 1  Trouble concentrating - - - - 1  Moving slowly or fidgety/restless - - - - 1  Suicidal thoughts - - - - 1  PHQ-9 Score - - - - 17  Difficult doing work/chores - - - - Somewhat difficult   Relevant past medical, surgical, family and social history reviewed Past Medical History:  Diagnosis Date  . ADHD (attention deficit hyperactivity disorder) 07/30/2015  . Anticoagulation goal of INR 2 to 3 07/30/2015  . Arthritis    shoulder, both knees, hip, back  . Coronary artery disease   . Depression   . Diabetes mellitus without complication (HCC)   . Factor V Leiden (HCC)   . History of shingles   . Hyperlipidemia   .  Hypertension   . Insomnia   . Intermittent explosive disorder 07/30/2015  . OSA (obstructive sleep apnea)   . Testicular dysfunction    Past Surgical History:  Procedure Laterality Date  . BACK SURGERY    . CORONARY ANGIOPLASTY WITH STENT PLACEMENT  1995  . HEMORRHOID SURGERY    . UVULOPLASTY     Family History  Problem Relation Age of Onset  . Anuerysm Father   . Depression Mother   . Clotting disorder Mother   . Depression Sister        taking venlafaxine  . Cancer Son        lung  . Heart attack Paternal Grandfather   . Depression Sister   . Stroke Brother   . Heart disease Neg Hx    Social History   Tobacco Use  . Smoking status: Former Smoker    Types: Cigarettes    Last attempt to quit: 01/07/1991    Years since quitting: 26.1  . Smokeless tobacco: Former NeurosurgeonUser    Types: Chew  . Tobacco comment: social smoker when he did smoke  Substance Use Topics  . Alcohol use: No    Alcohol/week: 0.0 oz  . Drug use: No   Interim medical history since last visit reviewed. Allergies and medications reviewed  Review of Systems Per HPI unless specifically indicated above     Objective:    BP 118/74 (BP Location: Right Arm, Patient Position: Sitting, Cuff Size: Large)   Pulse 89   Temp 98 F (36.7 C) (Oral)   Ht 6\' 1"  (1.854 m)   Wt 216 lb 9.6 oz (98.2 kg)   SpO2 99%   BMI 28.58 kg/m   Wt Readings from Last 3 Encounters:  03/06/17 216 lb 9.6 oz (98.2 kg)  02/02/17 214 lb 12.8 oz (97.4 kg)  01/02/17 214 lb 9.6 oz (97.3 kg)    Physical Exam  Constitutional: He appears well-developed and well-nourished. No distress.  Eyes: No scleral icterus.  Cardiovascular: Normal rate and regular rhythm.  Pulmonary/Chest: Effort normal and breath sounds normal.  Neurological: He is alert.  Skin: No pallor.  Psychiatric: He has a normal mood and affect.   Diabetic Foot Form - Detailed   Diabetic Foot Exam - detailed Diabetic Foot exam was performed with the following  findings:  Yes 03/06/2017 11:00 AM  Visual Foot Exam completed.:  Yes  Pulse Foot Exam completed.:  Yes  Right Dorsalis Pedis:  Present Left Dorsalis Pedis:  Present  Sensory Foot Exam Completed.:  Yes Semmes-Weinstein Monofilament Test R Site 1-Great Toe:  Pos L Site 1-Great Toe:  Pos        Results for orders placed or performed in visit on 03/06/17  Basic metabolic panel  Result Value Ref Range   Glucose 106    Creatinine 1.0 0.6 - 1.3   Sodium 142 137 - 147  Lipid panel  Result Value Ref Range   Triglycerides 332 (A) 40 - 160   Cholesterol 245 (A) 0 - 200   HDL 37 35 - 70   LDL Cholesterol 142   Hepatic function panel  Result Value Ref Range   ALT 36 10 - 40   AST 44 (A) 14 - 40  Hemoglobin A1c  Result Value Ref Range   Hemoglobin A1C 7.5       Assessment & Plan:   Problem List Items Addressed This Visit      Cardiovascular and Mediastinum   Essential hypertension (Chronic)    Well-controlled today        Endocrine   Type 2 diabetes, uncontrolled, with neuropathy (HCC) (Chronic)    [facility]; he'll keep working on trying to eat better and walk a little more; foot exam by MD today        Other   Intermittent explosive disorder (Chronic)    Supportive listening; patient reports doing well these last several months      B12 deficiency    Monthly B12 injections      Relevant Medications   cyanocobalamin ((VITAMIN B-12)) injection 1,000 mcg (Completed)       Follow up plan: No Follow-up on file.  An after-visit summary was printed and given to the patient at check-out.  Please see the patient instructions which may contain other information and recommendations beyond what is mentioned above in the assessment and plan.  Meds ordered this encounter  Medications  . cyanocobalamin ((VITAMIN B-12)) injection 1,000 mcg    Orders Placed This Encounter  Procedures  . Basic metabolic panel  . Lipid panel  . Hepatic function panel  .  Hemoglobin A1c

## 2017-03-06 NOTE — Assessment & Plan Note (Signed)
Managed primarily by [facility]; he'll keep working on trying to eat better and walk a little more; foot exam by MD today

## 2017-03-06 NOTE — Assessment & Plan Note (Signed)
Well controlled today.

## 2017-03-06 NOTE — Patient Instructions (Signed)
Return in one month for your next visit and B12 injection

## 2017-03-13 ENCOUNTER — Encounter: Payer: Self-pay | Admitting: Family Medicine

## 2017-04-06 ENCOUNTER — Encounter: Payer: Self-pay | Admitting: Family Medicine

## 2017-04-06 ENCOUNTER — Ambulatory Visit (INDEPENDENT_AMBULATORY_CARE_PROVIDER_SITE_OTHER): Payer: Medicare HMO | Admitting: Family Medicine

## 2017-04-06 VITALS — BP 128/68 | HR 80 | Temp 97.7°F | Wt 219.4 lb

## 2017-04-06 DIAGNOSIS — E1140 Type 2 diabetes mellitus with diabetic neuropathy, unspecified: Secondary | ICD-10-CM

## 2017-04-06 DIAGNOSIS — E538 Deficiency of other specified B group vitamins: Secondary | ICD-10-CM

## 2017-04-06 DIAGNOSIS — Z638 Other specified problems related to primary support group: Secondary | ICD-10-CM

## 2017-04-06 DIAGNOSIS — E1165 Type 2 diabetes mellitus with hyperglycemia: Secondary | ICD-10-CM | POA: Diagnosis not present

## 2017-04-06 DIAGNOSIS — IMO0002 Reserved for concepts with insufficient information to code with codable children: Secondary | ICD-10-CM

## 2017-04-06 MED ORDER — CYANOCOBALAMIN 1000 MCG/ML IJ SOLN
1000.0000 ug | Freq: Once | INTRAMUSCULAR | Status: AC
  Administered 2017-04-06: 1000 ug via INTRAMUSCULAR

## 2017-04-06 NOTE — Patient Instructions (Signed)
Return in one month for B12, return in 2 months with Dr. Sherie DonLada

## 2017-04-06 NOTE — Assessment & Plan Note (Signed)
Supportive listening; he sees his psychiatrist tomorrow

## 2017-04-06 NOTE — Assessment & Plan Note (Signed)
[facility] exam [facility] today

## 2017-04-06 NOTE — Progress Notes (Signed)
BP 128/68 (BP Location: Left Arm, Patient Position: Sitting, Cuff Size: Large)   Pulse 80   Temp 97.7 F (36.5 C) (Oral)   Wt 219 lb 6.4 oz (99.5 kg)   SpO2 97%   BMI 28.95 kg/m    Subjective:    Patient ID: Drew Morgan, male    DOB: Apr 16, 1940, 77 y.o.   MRN: [id number]  HPI: Drew Morgan is a 77 y.o. male  Chief Complaint  Patient presents with  . Follow-up  . Injections    B12    HPI Patient is here for follow-up, monthly B12 injection, support; he is going to see psychiatrist tomorrow; his big issue now is dealing with rejection from his wife; he has prayed about this  Type 2 diabetes Average FSBS 180 lately, going to the [place] tomorrow They will be working on his sugars; they will do his labs  Blood thinner; on the pill; no blood in the urine or stool  Having pain in the left knee; arthritis; may have it replaced this summer; I asked about the voltaren cream He would go through the [place]; won't cot $50  Depression screen [facility] 2/9 04/06/2017 03/06/2017 02/02/2017 01/02/2017 11/03/2016  Decreased Interest 0 0 0 0 0  Down, Depressed, Hopeless 0 0 0 1 1  PHQ - 2 Score 0 0 0 1 1  Altered sleeping - - - - -  Tired, decreased energy - - - - -  Change in appetite - - - - -  Feeling bad or failure about yourself  - - - - -  Trouble concentrating - - - - -  Moving slowly or fidgety/restless - - - - -  Suicidal thoughts - - - - -  PHQ-9 Score - - - - -  Difficult doing work/chores - - - - -    Relevant past medical, surgical, family and social history reviewed Past Medical History:  Diagnosis Date  . ADHD (attention deficit hyperactivity disorder) 07/30/2015  . Anticoagulation goal of INR 2 to 3 07/30/2015  . Arthritis    shoulder, both knees, hip, back  . Coronary artery disease   . Depression   . Diabetes mellitus without complication (HCC)   . Factor V Leiden (HCC)   . History of shingles   . Hyperlipidemia   . Hypertension   . Insomnia   . Intermittent  explosive disorder 07/30/2015  . OSA (obstructive sleep apnea)   . Testicular dysfunction    Past Surgical History:  Procedure Laterality Date  . BACK SURGERY    . CORONARY ANGIOPLASTY WITH STENT PLACEMENT  1995  . HEMORRHOID SURGERY    . UVULOPLASTY     Family History  Problem Relation Age of Onset  . Anuerysm Father   . Depression Mother   . Clotting disorder Mother   . Depression Sister        taking venlafaxine  . Cancer Son        lung  . Heart attack Paternal Grandfather   . Depression Sister   . Stroke Brother   . Heart disease Neg Hx    Social History   Tobacco Use  . Smoking status: Former Smoker    Types: Cigarettes    Last attempt to quit: 01/07/1991    Years since quitting: 26.2  . Smokeless tobacco: Former Neurosurgeon    Types: Chew  . Tobacco comment: social smoker when he did smoke  Substance Use Topics  . Alcohol use: No  Alcohol/week: 0.0 oz  . Drug use: No    Interim medical history since last visit reviewed. Allergies and medications reviewed  Review of Systems Per HPI unless specifically indicated above     Objective:    BP 128/68 (BP Location: Left Arm, Patient Position: Sitting, Cuff Size: Large)   Pulse 80   Temp 97.7 F (36.5 C) (Oral)   Wt 219 lb 6.4 oz (99.5 kg)   SpO2 97%   BMI 28.95 kg/m   Wt Readings from Last 3 Encounters:  04/06/17 219 lb 6.4 oz (99.5 kg)  03/06/17 216 lb 9.6 oz (98.2 kg)  02/02/17 214 lb 12.8 oz (97.4 kg)    Physical Exam  Constitutional: He appears well-developed and well-nourished. No distress.  Eyes: No scleral icterus.  Cardiovascular: Normal rate and regular rhythm.  Pulmonary/Chest: Effort normal and breath sounds normal.  Neurological: He is alert.  Skin: No pallor.  Psychiatric: He has a normal mood and affect.   Diabetic Foot Form - Detailed   Diabetic Foot Exam - detailed Diabetic Foot exam was performed with the following findings:  Yes 04/06/2017 12:33 PM  Visual Foot Exam completed.:  Yes   Pulse Foot Exam completed.:  Yes  Right Dorsalis Pedis:  Present Left Dorsalis Pedis:  Present  Sensory Foot Exam Completed.:  Yes Semmes-Weinstein Monofilament Test R Site 1-Great Toe:  Pos L Site 1-Great Toe:  Pos        Results for orders placed or performed in visit on 03/06/17  Basic metabolic panel  Result Value Ref Range   Glucose 106    Creatinine 1.0 0.6 - 1.3   Sodium 142 137 - 147  Lipid panel  Result Value Ref Range   Triglycerides 332 (A) 40 - 160   Cholesterol 245 (A) 0 - 200   HDL 37 35 - 70   LDL Cholesterol 142   Hepatic function panel  Result Value Ref Range   ALT 36 10 - 40   AST 44 (A) 14 - 40  Hemoglobin A1c  Result Value Ref Range   Hemoglobin A1C 7.5       Assessment & Plan:   Problem List Items Addressed This Visit      Endocrine   Type 2 diabetes, uncontrolled, with neuropathy (HCC) (Chronic)    [facility]; foot exam by MD today        Other   Stress due to family tension    Supportive listening; he sees his psychiatrist tomorrow      B12 deficiency - Primary    1000 mcg IM x one today; return monthly for injections      Relevant Medications   cyanocobalamin ((VITAMIN B-12)) injection 1,000 mcg (Completed)       Follow up plan: No Follow-up on file.  An after-visit summary was printed and given to the patient at check-out.  Please see the patient instructions which may contain other information and recommendations beyond what is mentioned above in the assessment and plan.  Meds ordered this encounter  Medications  . cyanocobalamin ((VITAMIN B-12)) injection 1,000 mcg    No orders of the defined types were placed in this encounter.

## 2017-04-06 NOTE — Assessment & Plan Note (Signed)
1000 mcg IM x one today; return monthly for injections

## 2017-04-22 ENCOUNTER — Telehealth: Payer: Self-pay | Admitting: Family Medicine

## 2017-04-22 NOTE — Telephone Encounter (Signed)
I spoke with patient; his wife left him He denies SI/HI; asking for prayers; I am right here with him Explained no social media contact, just through office route Prayers will be lifted He thanked me for the call

## 2017-04-22 NOTE — Telephone Encounter (Signed)
Copied from CRM [phone]. Topic: General - Other >> Apr 22, 2017 11:50 AM Lelon FrohlichGolden, Tashia, RMA wrote: Reason for CRM: pt would like a call back would not state for what pt would like for nurse to call his cell number

## 2017-04-22 NOTE — Telephone Encounter (Signed)
Called pt back stated he wanted Dr. Sherie DonLada to call him back and would not tell me what it was regarding I tried?

## 2017-05-05 ENCOUNTER — Ambulatory Visit: Payer: Medicare HMO

## 2017-05-08 ENCOUNTER — Ambulatory Visit (INDEPENDENT_AMBULATORY_CARE_PROVIDER_SITE_OTHER): Payer: Medicare HMO

## 2017-05-08 DIAGNOSIS — E538 Deficiency of other specified B group vitamins: Secondary | ICD-10-CM | POA: Diagnosis not present

## 2017-05-08 MED ORDER — CYANOCOBALAMIN 1000 MCG/ML IJ SOLN
1000.0000 ug | Freq: Once | INTRAMUSCULAR | Status: AC
Start: 2017-05-08 — End: 2017-05-08
  Administered 2017-05-08: 1000 ug via INTRAMUSCULAR

## 2017-06-30 ENCOUNTER — Ambulatory Visit: Payer: Medicare HMO | Admitting: Family Medicine

## 2017-07-24 ENCOUNTER — Encounter: Payer: Self-pay | Admitting: Family Medicine

## 2017-07-24 ENCOUNTER — Ambulatory Visit (INDEPENDENT_AMBULATORY_CARE_PROVIDER_SITE_OTHER): Payer: Medicare HMO | Admitting: Family Medicine

## 2017-07-24 ENCOUNTER — Encounter

## 2017-07-24 VITALS — BP 116/62 | HR 77 | Temp 98.1°F | Resp 16 | Ht 73.0 in | Wt 220.5 lb

## 2017-07-24 DIAGNOSIS — M25561 Pain in right knee: Secondary | ICD-10-CM

## 2017-07-24 DIAGNOSIS — S91109A Unspecified open wound of unspecified toe(s) without damage to nail, initial encounter: Secondary | ICD-10-CM | POA: Diagnosis not present

## 2017-07-24 DIAGNOSIS — I10 Essential (primary) hypertension: Secondary | ICD-10-CM | POA: Diagnosis not present

## 2017-07-24 DIAGNOSIS — E1165 Type 2 diabetes mellitus with hyperglycemia: Secondary | ICD-10-CM

## 2017-07-24 DIAGNOSIS — E538 Deficiency of other specified B group vitamins: Secondary | ICD-10-CM

## 2017-07-24 DIAGNOSIS — R61 Generalized hyperhidrosis: Secondary | ICD-10-CM | POA: Diagnosis not present

## 2017-07-24 DIAGNOSIS — Z23 Encounter for immunization: Secondary | ICD-10-CM

## 2017-07-24 DIAGNOSIS — R740 Nonspecific elevation of levels of transaminase and lactic acid dehydrogenase [LDH]: Secondary | ICD-10-CM | POA: Diagnosis not present

## 2017-07-24 DIAGNOSIS — M25562 Pain in left knee: Secondary | ICD-10-CM

## 2017-07-24 DIAGNOSIS — N3941 Urge incontinence: Secondary | ICD-10-CM

## 2017-07-24 DIAGNOSIS — D6851 Activated protein C resistance: Secondary | ICD-10-CM | POA: Diagnosis not present

## 2017-07-24 DIAGNOSIS — E1140 Type 2 diabetes mellitus with diabetic neuropathy, unspecified: Secondary | ICD-10-CM

## 2017-07-24 DIAGNOSIS — R0989 Other specified symptoms and signs involving the circulatory and respiratory systems: Secondary | ICD-10-CM | POA: Diagnosis not present

## 2017-07-24 DIAGNOSIS — E782 Mixed hyperlipidemia: Secondary | ICD-10-CM | POA: Diagnosis not present

## 2017-07-24 DIAGNOSIS — G8929 Other chronic pain: Secondary | ICD-10-CM

## 2017-07-24 DIAGNOSIS — IMO0002 Reserved for concepts with insufficient information to code with codable children: Secondary | ICD-10-CM

## 2017-07-24 DIAGNOSIS — R7401 Elevation of levels of liver transaminase levels: Secondary | ICD-10-CM

## 2017-07-24 MED ORDER — CYANOCOBALAMIN 1000 MCG/ML IJ SOLN
1000.0000 ug | Freq: Once | INTRAMUSCULAR | Status: AC
  Administered 2017-07-24: 1000 ug via INTRAMUSCULAR

## 2017-07-24 MED ORDER — DICLOFENAC SODIUM 1 % TD GEL: TRANSDERMAL | 1 refills | Status: DC

## 2017-07-24 NOTE — Assessment & Plan Note (Signed)
Check A1c and urine microalb:Cr

## 2017-07-24 NOTE — Progress Notes (Signed)
BP 116/62   Pulse 77   Temp 98.1 F (36.7 C) (Oral)   Resp 16   Ht  (1.854 m)   Wt 220 lb 8 oz (100 kg)   SpO2 94%   BMI 29.09 kg/m    Subjective:    Patient ID: Drew Morgan, male    DOB: 10/28/1940, 76 y.o.   MRN: [id number]  HPI: Drew Morgan is a 77 y.o. male  Chief Complaint  Patient presents with  . Follow-up    HPI Patient is here for follow-up  He has scratches on his 3rd and th toes on the LEFT foot; dog stepped on it; he is putting salve He has soreness in his feet He has scratches on his legs too Due for B12 shot His wife left him and he actually felt better after a week; the criticism stopped, the badgering all stopped Still seeing his counselor at the [place]; goes every month; they said he was doing better He is gaining weight; he has been eating more; he has to be careful with eating; likes chocolate, caramel; likes peanut butter; most of the sweets are honey; strawberries are in and eating those; does eat vegetables He hears some lung noises at night; no chest pain; breathes through his nose; sweats at night; no chest xray recently Knee pain; occasionally takes 2 aleve Type 2 diabetes; no A1c recently; needs to get to the bathroom fast; some leaking; no odor; no blood in the urine Hx of liver enzymes and normal again one month ago per patient  Fall Risk  07/24/2017 04/06/2017 03/06/2017 02/02/2017 01/02/2017  Falls in the past year? Yes No No No Yes  Number falls in past yr: 2 or more - - - 2 or more  Injury with Fall? No - - - No    Functional Status Survey: Is the patient deaf or have difficulty hearing?: Yes Does the patient have difficulty seeing, even when wearing glasses/contacts?: No Does the patient have difficulty concentrating, remembering, or making decisions?: No Does the patient have difficulty walking or climbing stairs?: No Does the patient have difficulty dressing or bathing?: No Does the patient have difficulty doing errands  alone such as visiting a doctor's office or shopping?: No   Depression screen [facility] 2/9 07/24/2017 04/06/2017 03/06/2017 02/02/2017 01/02/2017  Decreased Interest 0 0 0 0 0  Down, Depressed, Hopeless 1 0 0 0 1  PHQ - 2 Score 1 0 0 0 1  Altered sleeping - - - - -  Tired, decreased energy - - - - -  Change in appetite - - - - -  Feeling bad or failure about yourself  - - - - -  Trouble concentrating - - - - -  Moving slowly or fidgety/restless - - - - -  Suicidal thoughts - - - - -  PHQ-9 Score - - - - -  Difficult doing work/chores - - - - -    Relevant past medical, surgical, family and social history reviewed Past Medical History:  Diagnosis Date  . ADHD (attention deficit hyperactivity disorder) 07/30/2015  . Anticoagulation goal of INR 2 to 3 07/30/2015  . Arthritis    shoulder, both knees, hip, back  . Coronary artery disease   . Depression   . Diabetes mellitus without complication (HCC)   . Factor V Leiden (HCC)   . History of shingles   . Hyperlipidemia   . Hypertension   . Insomnia   . Intermittent  explosive disorder 07/30/2015  . OSA (obstructive sleep apnea)   . Testicular dysfunction    Past Surgical History:  Procedure Laterality Date  . BACK SURGERY    . CORONARY ANGIOPLASTY WITH STENT PLACEMENT  1995  . HEMORRHOID SURGERY    . UVULOPLASTY     Family History  Problem Relation Age of Onset  . Anuerysm Father   . Depression Mother   . Clotting disorder Mother   . Depression Sister        taking venlafaxine  . Cancer Son        lung  . Heart attack Paternal Grandfather   . Depression Sister   . Stroke Brother   . Heart disease Neg Hx    Social History   Tobacco Use  . Smoking status: Former Smoker    Types: Cigarettes    Last attempt to quit: 01/07/1991    Years since quitting: 26.5  . Smokeless tobacco: Former Neurosurgeon    Types: Chew  . Tobacco comment: social smoker when he did smoke  Substance Use Topics  . Alcohol use: No    Alcohol/week: 0.0  oz  . Drug use: No    Interim medical history since last visit reviewed. Allergies and medications reviewed  Review of Systems Per HPI unless specifically indicated above     Objective:    BP 116/62   Pulse 77   Temp 98.1 F (36.7 C) (Oral)   Resp 16   Ht  (1.854 m)   Wt 220 lb 8 oz (100 kg)   SpO2 94%   BMI 29.09 kg/m   Wt Readings from Last 3 Encounters:  07/24/17 220 lb 8 oz (100 kg)  04/06/17 219 lb 6.4 oz (99.5 kg)  03/06/17 216 lb 9.6 oz (98.2 kg)    Physical Exam  Constitutional: He appears well-developed and well-nourished. No distress.  HENT:  Head: Normocephalic and atraumatic.  Eyes: EOM are normal. No scleral icterus.  Neck: No thyromegaly present.  Cardiovascular: Normal rate and regular rhythm.  Pulmonary/Chest: Effort normal. He has wheezes (low-pitched expiratory).  Abdominal: Soft. Bowel sounds are normal. He exhibits no distension.  Musculoskeletal: He exhibits no edema.  Neurological: Coordination normal.  Skin: Skin is warm and dry. No pallor.  Psychiatric: He has a normal mood and affect. His behavior is normal. Judgment and thought content normal.   Diabetic Foot Form - Detailed   Diabetic Foot Exam - detailed Diabetic Foot exam was performed with the following findings:  Yes 07/24/2017  2:02 PM  Visual Foot Exam completed.:  Yes  Pulse Foot Exam completed.:  Yes  Right Dorsalis Pedis:  Diminished Left Dorsalis Pedis:  Diminished  Sensory Foot Exam Completed.:  Yes Semmes-Weinstein Monofilament Test R Site 1-Great Toe:  Pos L Site 1-Great Toe:  Pos    Comments:  3rd tooe and 4th toe, scratches, no prox erythema, no drainage, just thick scab and mild redness at base of scab       Assessment & Plan:   Problem List Items Addressed This Visit      Cardiovascular and Mediastinum   Essential hypertension (Chronic)    controlled      Relevant Orders   Urine Microalbumin w/creat. ratio (Completed)     Endocrine   Type 2 diabetes,  uncontrolled, with neuropathy (HCC) (Chronic)    Check A1c and urine microalb:Cr      Relevant Orders   Hemoglobin A1C (Completed)   Urine Microalbumin w/creat. ratio (Completed)  Hematopoietic and Hemostatic   Factor V Leiden (HCC)    On xarelto      Relevant Orders   CBC with Differential/Platelet (Completed)     Other   B12 deficiency   Relevant Medications   cyanocobalamin ((VITAMIN B-12)) injection 1,000 mcg (Completed)   Other Relevant Orders   B12 (Completed)   Knee pain, bilateral    Trial of voltaren; on xarelto, so using just topically; not orally; risk of GI bleed higher with oral NSAIDs; so use topical sparingly      Hyperlipidemia (Chronic)    Some dietary indiscretion lately      Elevated serum glutamic pyruvic transaminase (SGPT) level    Just checked at the [place]; normal per patient       Other Visit Diagnoses    Open wound of toe, initial encounter    -  Primary   Relevant Orders   Td : Tetanus/diphtheria >7yo Preservative  free (Completed)   Excessive sweating       Relevant Orders   DG Chest 2 View (Completed)   CBC with Differential/Platelet   CBC with Differential/Platelet (Completed)   TSH (Completed)   Abnormal lung sounds       Relevant Orders   DG Chest 2 View (Completed)   Urge incontinence of urine           Follow up plan: Return in about 2 months (around 09/23/2017) for follow-up visit with Dr. Sherie Don.  An after-visit summary was printed and given to the patient at check-out.  Please see the patient instructions which may contain other information and recommendations beyond what is mentioned above in the assessment and plan.  Meds ordered this encounter  Medications  . cyanocobalamin ((VITAMIN B-12)) injection 1,000 mcg  . diclofenac sodium (VOLTAREN) 1 % GEL    Sig: Apply 2 grams to hands or 4 grams to knees up to 4 times a day; use just where needed    Dispense:  300 g    Refill:  1    Requesting tier exemption to level 2 for  arthritis for patient with diabetes and hypertension; thank you    Orders Placed This Encounter  Procedures  . DG Chest 2 View  . Td : Tetanus/diphtheria >7yo Preservative  free  . CBC with Differential/Platelet  . Hemoglobin A1C  . CBC with Differential/Platelet  . TSH  . B12  . Urine Microalbumin w/creat. ratio

## 2017-07-24 NOTE — Patient Instructions (Signed)
Let's get labs today If you have not heard anything from my staff in a week about any orders/referrals/studies from today, please contact us here to follow-up [phone] You received the vaccine to protect against tetanus and diphtheria today; the tetanus and diphtheria portions will provide protection up to ten years Return in one month for B12 shot

## 2017-07-24 NOTE — Assessment & Plan Note (Signed)
Some dietary indiscretion lately

## 2017-07-24 NOTE — Assessment & Plan Note (Signed)
On xarelto 

## 2017-07-24 NOTE — Assessment & Plan Note (Signed)
Trial of voltaren; on xarelto, so using just topically; not orally; risk of GI bleed higher with oral NSAIDs; so use topical sparingly

## 2017-07-24 NOTE — Assessment & Plan Note (Signed)
Just checked at the [place]; normal per patient

## 2017-07-24 NOTE — Assessment & Plan Note (Signed)
controlled 

## 2017-07-28 DIAGNOSIS — E538 Deficiency of other specified B group vitamins: Secondary | ICD-10-CM | POA: Diagnosis not present

## 2017-07-28 DIAGNOSIS — E1140 Type 2 diabetes mellitus with diabetic neuropathy, unspecified: Secondary | ICD-10-CM | POA: Diagnosis not present

## 2017-07-28 DIAGNOSIS — D6851 Activated protein C resistance: Secondary | ICD-10-CM | POA: Diagnosis not present

## 2017-07-28 DIAGNOSIS — E1165 Type 2 diabetes mellitus with hyperglycemia: Secondary | ICD-10-CM | POA: Diagnosis not present

## 2017-07-28 DIAGNOSIS — I10 Essential (primary) hypertension: Secondary | ICD-10-CM | POA: Diagnosis not present

## 2017-07-28 DIAGNOSIS — R61 Generalized hyperhidrosis: Secondary | ICD-10-CM | POA: Diagnosis not present

## 2017-07-29 LAB — CBC WITH DIFFERENTIAL/PLATELET
BASOS: 1 %
Basophils Absolute: 0.1 10*3/uL (ref 0.0–0.2)
EOS (ABSOLUTE): 0.4 10*3/uL (ref 0.0–0.4)
EOS: 4 %
Hematocrit: 43.1 % (ref 37.5–51.0)
Hemoglobin: 15 g/dL (ref 13.0–17.7)
Immature Grans (Abs): 0 10*3/uL (ref 0.0–0.1)
Immature Granulocytes: 0 %
LYMPHS ABS: 3.4 10*3/uL — AB (ref 0.7–3.1)
Lymphs: 34 %
MCH: 30.7 pg (ref 26.6–33.0)
MCHC: 34.8 g/dL (ref 31.5–35.7)
MCV: 88 fL (ref 79–97)
MONOCYTES: 11 %
MONOS ABS: 1.1 10*3/uL — AB (ref 0.1–0.9)
Neutrophils Absolute: 4.9 10*3/uL (ref 1.4–7.0)
Neutrophils: 50 %
Platelets: 261 10*3/uL (ref 150–450)
RBC: 4.88 x10E6/uL (ref 4.14–5.80)
RDW: 14 % (ref 12.3–15.4)
WBC: 9.8 10*3/uL (ref 3.4–10.8)

## 2017-07-29 LAB — MICROALBUMIN / CREATININE URINE RATIO
Creatinine, Urine: 109.2 mg/dL
MICROALB/CREAT RATIO: 7.2 mg/g{creat} (ref 0.0–30.0)
MICROALBUM., U, RANDOM: 7.9 ug/mL

## 2017-07-29 LAB — VITAMIN B12: VITAMIN B 12: 506 pg/mL (ref 232–1245)

## 2017-07-29 LAB — HEMOGLOBIN A1C
Est. average glucose Bld gHb Est-mCnc: 177 mg/dL
Hgb A1c MFr Bld: 7.8 % — ABNORMAL HIGH (ref 4.8–5.6)

## 2017-07-29 LAB — TSH: TSH: 2.48 u[IU]/mL (ref 0.450–4.500)

## 2017-07-30 ENCOUNTER — Ambulatory Visit
Admission: RE | Admit: 2017-07-30 | Discharge: 2017-07-30 | Disposition: A | Discharge status: Home / Self Care | Payer: Medicare HMO | Source: Ambulatory Visit | Attending: Family Medicine | Admitting: Family Medicine

## 2017-07-30 DIAGNOSIS — R0602 Shortness of breath: Secondary | ICD-10-CM | POA: Diagnosis not present

## 2017-07-30 DIAGNOSIS — R0989 Other specified symptoms and signs involving the circulatory and respiratory systems: Secondary | ICD-10-CM | POA: Diagnosis not present

## 2017-07-30 DIAGNOSIS — R61 Generalized hyperhidrosis: Secondary | ICD-10-CM | POA: Diagnosis not present

## 2017-09-22 ENCOUNTER — Encounter: Payer: Self-pay | Admitting: Family Medicine

## 2017-09-22 ENCOUNTER — Ambulatory Visit (INDEPENDENT_AMBULATORY_CARE_PROVIDER_SITE_OTHER): Payer: Medicare HMO | Admitting: Family Medicine

## 2017-09-22 VITALS — BP 138/82 | HR 92 | Temp 98.1°F | Resp 12 | Ht 73.0 in | Wt 224.0 lb

## 2017-09-22 DIAGNOSIS — E538 Deficiency of other specified B group vitamins: Secondary | ICD-10-CM

## 2017-09-22 DIAGNOSIS — M25561 Pain in right knee: Secondary | ICD-10-CM | POA: Diagnosis not present

## 2017-09-22 DIAGNOSIS — IMO0002 Reserved for concepts with insufficient information to code with codable children: Secondary | ICD-10-CM

## 2017-09-22 DIAGNOSIS — Z638 Other specified problems related to primary support group: Secondary | ICD-10-CM | POA: Diagnosis not present

## 2017-09-22 DIAGNOSIS — E1165 Type 2 diabetes mellitus with hyperglycemia: Secondary | ICD-10-CM

## 2017-09-22 DIAGNOSIS — J449 Chronic obstructive pulmonary disease, unspecified: Secondary | ICD-10-CM

## 2017-09-22 DIAGNOSIS — G8929 Other chronic pain: Secondary | ICD-10-CM | POA: Diagnosis not present

## 2017-09-22 DIAGNOSIS — E1140 Type 2 diabetes mellitus with diabetic neuropathy, unspecified: Secondary | ICD-10-CM | POA: Diagnosis not present

## 2017-09-22 DIAGNOSIS — D6851 Activated protein C resistance: Secondary | ICD-10-CM

## 2017-09-22 DIAGNOSIS — M25562 Pain in left knee: Secondary | ICD-10-CM

## 2017-09-22 DIAGNOSIS — I10 Essential (primary) hypertension: Secondary | ICD-10-CM | POA: Diagnosis not present

## 2017-09-22 HISTORY — DX: Chronic obstructive pulmonary disease, unspecified: J44.9

## 2017-09-22 MED ORDER — CYANOCOBALAMIN 1000 MCG/ML IJ SOLN
1000.0000 ug | Freq: Once | INTRAMUSCULAR | Status: AC
  Administered 2017-09-22: 1000 ug via INTRAMUSCULAR

## 2017-09-22 NOTE — Progress Notes (Signed)
BP 138/82   Pulse 92   Temp 98.1 F (36.7 C) (Oral)   Resp 12   Ht 6\' 1"  (1.854 m)   Wt 224 lb (101.6 kg)   SpO2 96%   BMI 29.55 kg/m    Subjective:    Patient ID: Drew Morgan, male    DOB: [birth year], 77 y.o.   MRN: [id number]  HPI: Drew Morgan is a 76 y.o. male  Chief Complaint  Patient presents with  . Follow-up    HPI Patient is here for f/u  He has type 2 diabetes mellitus[facility]  First stage of COPD and asthma; [facility] S[facility] 80/4.5; does not go out in the extreme heat; can't take it  Factor V leiden; on Xarelto  He continues to suffer from depression and IED; he is seeing a psychiatrist at the [place]; his wife returned, but they function more like roommates, each with own rooms, bathrooms, etc.; he feels rejected; cites his faith as what helps him through  Arthritis in his knees; voltaren not helping that much; mother died from a clot after joint surgery so he's not eager to have knee surgery; they want to get both knees done, shoulder, and hip; they have a pain clinic at the [place]; aleve will help and he can manage the day; use to take morphine for years; was on methadone, never really seemed to give him relief; never made him high  Vitamin B12 deficiency; patient read that B12 can cause restenosis of stents and wanted to ask about that  Depression screen [facility] 2/9 09/22/2017 07/24/2017 04/06/2017 03/06/2017 02/02/2017  Decreased Interest 1 0 0 0 0  Down, Depressed, Hopeless 3 1 0 0 0  PHQ - 2 Score 4 1 0 0 0  Altered sleeping 3 - - - -  Tired, decreased energy 3 - - - -  Change in appetite 3 - - - -  Feeling bad or failure about yourself  0 - - - -  Trouble concentrating 3 - - - -  Moving slowly or fidgety/restless 0 - - - -  Suicidal thoughts 0 - - - -  PHQ-9 Score 16 - - - -  Difficult doing work/chores Very difficult - - - -    Relevant past medical, surgical, family and social history reviewed Past Medical History:  Diagnosis  Date  . ADHD (attention deficit hyperactivity disorder) 07/30/2015  . Anticoagulation goal of INR 2 to 3 07/30/2015  . Arthritis    shoulder, both knees, hip, back  . Chronic obstructive pulmonary disease (HCC) 09/22/2017  . Coronary artery disease   . Depression   . Diabetes mellitus without complication (HCC)   . Factor V Leiden (HCC)   . History of shingles   . Hyperlipidemia   . Hypertension   . Insomnia   . Intermittent explosive disorder 07/30/2015  . OSA (obstructive sleep apnea)   . Testicular dysfunction    Past Surgical History:  Procedure Laterality Date  . BACK SURGERY    . CORONARY ANGIOPLASTY WITH STENT PLACEMENT  1995  . HEMORRHOID SURGERY    . UVULOPLASTY     Family History  Problem Relation Age of Onset  . Anuerysm Father   . Depression Mother   . Clotting disorder Mother   . Depression Sister        taking venlafaxine  . Cancer Son        lung  . Heart attack Paternal Grandfather   .  Depression Sister   . Stroke Brother   . Heart disease Neg Hx    Social History   Tobacco Use  . Smoking status: Former Smoker    Types: Cigarettes    Last attempt to quit: 01/07/1991    Years since quitting: 26.7  . Smokeless tobacco: Former NeurosurgeonUser    Types: Chew  . Tobacco comment: social smoker when he did smoke  Substance Use Topics  . Alcohol use: No    Alcohol/week: 0.0 oz  . Drug use: No    Interim medical history since last visit reviewed. Allergies and medications reviewed  Review of Systems Per HPI unless specifically indicated above     Objective:    BP 138/82   Pulse 92   Temp 98.1 F (36.7 C) (Oral)   Resp 12   Ht 6\' 1"  (1.854 m)   Wt 224 lb (101.6 kg)   SpO2 96%   BMI 29.55 kg/m   Wt Readings from Last 3 Encounters:  09/22/17 224 lb (101.6 kg)  07/24/17 220 lb 8 oz (100 kg)  04/06/17 219 lb 6.4 oz (99.5 kg)    Physical Exam  Constitutional: He appears well-developed and well-nourished. No distress.  Eyes: No scleral icterus.    Cardiovascular: Normal rate and regular rhythm.  Pulmonary/Chest: Effort normal and breath sounds normal.  Neurological: He is alert.  Skin: No pallor.  Psychiatric: He has a normal mood and affect.   Diabetic Foot Form - Detailed   Diabetic Foot Exam - detailed Diabetic Foot exam was performed with the following findings:  Yes 09/22/2017 10:22 AM  Visual Foot Exam completed.:  Yes  Pulse Foot Exam completed.:  Yes  Right Dorsalis Pedis:  Present Left Dorsalis Pedis:  Present  Sensory Foot Exam Completed.:  Yes Semmes-Weinstein Monofilament Test R Site 1-Great Toe:  Pos L Site 1-Great Toe:  Pos        Results for orders placed or performed in visit on 07/24/17  Hemoglobin A1C  Result Value Ref Range   Hgb A1c MFr Bld 7.8 (H) 4.8 - 5.6 %   Est. average glucose Bld gHb Est-mCnc 177 mg/dL  CBC with Differential/Platelet  Result Value Ref Range   WBC 9.8 3.4 - 10.8 x10E3/uL   RBC 4.88 4.14 - 5.80 x10E6/uL   Hemoglobin 15.0 13.0 - 17.7 g/dL   Hematocrit 16.143.1 09.637.5 - 51.0 %   MCV 88 79 - 97 fL   MCH 30.7 26.6 - 33.0 pg   MCHC 34.8 31.5 - 35.7 g/dL   RDW 04.514.0 40.912.3 - 81.115.4 %   Platelets 261 150 - 450 x10E3/uL   Neutrophils 50 Not Estab. %   Lymphs 34 Not Estab. %   Monocytes 11 Not Estab. %   Eos 4 Not Estab. %   Basos 1 Not Estab. %   Neutrophils Absolute 4.9 1.4 - 7.0 x10E3/uL   Lymphocytes Absolute 3.4 (H) 0.7 - 3.1 x10E3/uL   Monocytes Absolute 1.1 (H) 0.1 - 0.9 x10E3/uL   EOS (ABSOLUTE) 0.4 0.0 - 0.4 x10E3/uL   Basophils Absolute 0.1 0.0 - 0.2 x10E3/uL   Immature Granulocytes 0 Not Estab. %   Immature Grans (Abs) 0.0 0.0 - 0.1 x10E3/uL  TSH  Result Value Ref Range   TSH 2.480 0.450 - 4.500 uIU/mL  B12  Result Value Ref Range   Vitamin B-12 506 232 - 1,245 pg/mL  Urine Microalbumin w/creat. ratio  Result Value Ref Range   Creatinine, Urine 109.2 Not Estab. mg/dL  Microalbumin, Urine 7.9 Not Estab. ug/mL   Microalb/Creat Ratio 7.2 0.0 - 30.0 mg/g creat       Assessment & Plan:   Problem List Items Addressed This Visit      Cardiovascular and Mediastinum   Essential hypertension (Chronic)    Controlled on the recheck        Respiratory   Chronic obstructive pulmonary disease (HCC)    [facility]; now on Symbicort      Relevant Medications   budesonide-formoterol (SYMBICORT) 80-4.5 MCG/ACT inhaler     Endocrine   Type 2 diabetes, uncontrolled, with neuropathy (HCC) - Primary (Chronic)    Foot exam; [facility]        Hematopoietic and Hemostatic   Factor V Leiden (HCC)    Continue anticoagulation        Other   Stress due to family tension    Seeing psychiatrist; supportive listening provided; suggested walk with topics for conversation not emotionally charged, nothing political, just nature or happy childhood memories, etc as a start      Knee pain, bilateral    Offered lidoderm patches, but he declined      B12 deficiency    I contacted a cardiologist about his concern about stent restenosis; not a concern per cardiologist; he may return monthly for B12 injections      Relevant Medications   cyanocobalamin ((VITAMIN B-12)) injection 1,000 mcg (Completed)       Follow up plan: Return in about 2 months (around 11/23/2017) for follow-up visit with Dr. Sherie Don; one month with CMA for B12 shot.  An after-visit summary was printed and given to the patient at check-out.  Please see the patient instructions which may contain other information and recommendations beyond what is mentioned above in the assessment and plan.  Meds ordered this encounter  Medications  . cyanocobalamin ((VITAMIN B-12)) injection 1,000 mcg    No orders of the defined types were placed in this encounter.

## 2017-09-22 NOTE — Assessment & Plan Note (Signed)
Controlled on the recheck

## 2017-09-22 NOTE — Assessment & Plan Note (Signed)
I contacted a cardiologist about his concern about stent restenosis; not a concern per cardiologist; he may return monthly for B12 injections

## 2017-09-22 NOTE — Assessment & Plan Note (Signed)
Offered lidoderm patches, but he declined

## 2017-09-22 NOTE — Assessment & Plan Note (Signed)
Seeing psychiatrist; supportive listening provided; suggested walk with topics for conversation not emotionally charged, nothing political, just nature or happy childhood memories, etc as a start

## 2017-09-22 NOTE — Assessment & Plan Note (Signed)
[facility]

## 2017-09-22 NOTE — Assessment & Plan Note (Signed)
Continue anticoagulation 

## 2017-09-22 NOTE — Assessment & Plan Note (Addendum)
Foot exam[facility]

## 2017-11-05 ENCOUNTER — Telehealth: Payer: Self-pay

## 2017-11-05 ENCOUNTER — Ambulatory Visit: Payer: Medicare HMO

## 2017-11-05 NOTE — Telephone Encounter (Signed)
Pt called to cancel AWV but did not reschedule appt. Called to resched AWV w/ [facility]. LVM requesting returned call.

## 2017-11-16 ENCOUNTER — Telehealth: Payer: Self-pay | Admitting: Family Medicine

## 2017-11-16 NOTE — Telephone Encounter (Signed)
Copied from CRM [id number]. Topic: Quick Communication - Rx Refill/Question >> Nov 16, 2017  4:42 PM Arlyss Gandy, NT wrote: Medication: amLODipine (NORVASC) 5 MG tablet, atenolol (TENORMIN) 50 MG tablet, and lisinopril (PRINIVIL,ZESTRIL) 40 MG tablet   Has the patient contacted their pharmacy? Yes.   (Agent: If no, request that the patient contact the pharmacy for the refill.) (Agent: If yes, when and what did the pharmacy advise?)  Preferred Pharmacy (with phone number or street name): [facility] - [place], [place] - [address] [phone] (Phone) [phone] (Fax)    Agent: Please be advised that RX refills may take up to 3 business days. We ask that you follow-up with your pharmacy.

## 2017-11-17 NOTE — Telephone Encounter (Signed)
Pt notified he was given year supply and should not need another refill until the end of novemember

## 2017-11-20 ENCOUNTER — Telehealth: Payer: Self-pay | Admitting: Family Medicine

## 2017-11-20 NOTE — Telephone Encounter (Signed)
Don't know if this is wrong number, I have tried multiple times and have gotten disconnected

## 2017-11-20 NOTE — Telephone Encounter (Signed)
Copied from CRM [phone]. Topic: Quick Communication - Rx Refill/Question >> Nov 20, 2017  2:46 PM Raquel SarnaHayes, Teresa G wrote: [facility] is needing call back regarding the diclofenac sodium (VOLTAREN) 1 % GEL.  They have a few questions before filling Rx. Please call  [phone]  /  Ref [phone]

## 2017-11-24 ENCOUNTER — Ambulatory Visit (INDEPENDENT_AMBULATORY_CARE_PROVIDER_SITE_OTHER): Payer: Medicare HMO | Admitting: Family Medicine

## 2017-11-24 ENCOUNTER — Encounter: Payer: Self-pay | Admitting: Family Medicine

## 2017-11-24 VITALS — BP 132/68 | HR 77 | Temp 98.5°F | Ht 73.0 in | Wt 224.0 lb

## 2017-11-24 DIAGNOSIS — I10 Essential (primary) hypertension: Secondary | ICD-10-CM | POA: Diagnosis not present

## 2017-11-24 DIAGNOSIS — D6851 Activated protein C resistance: Secondary | ICD-10-CM

## 2017-11-24 DIAGNOSIS — E1165 Type 2 diabetes mellitus with hyperglycemia: Secondary | ICD-10-CM | POA: Diagnosis not present

## 2017-11-24 DIAGNOSIS — E782 Mixed hyperlipidemia: Secondary | ICD-10-CM | POA: Diagnosis not present

## 2017-11-24 DIAGNOSIS — E1140 Type 2 diabetes mellitus with diabetic neuropathy, unspecified: Secondary | ICD-10-CM | POA: Diagnosis not present

## 2017-11-24 DIAGNOSIS — M170 Bilateral primary osteoarthritis of knee: Secondary | ICD-10-CM | POA: Diagnosis not present

## 2017-11-24 DIAGNOSIS — IMO0002 Reserved for concepts with insufficient information to code with codable children: Secondary | ICD-10-CM

## 2017-11-24 DIAGNOSIS — Z638 Other specified problems related to primary support group: Secondary | ICD-10-CM

## 2017-11-24 DIAGNOSIS — E538 Deficiency of other specified B group vitamins: Secondary | ICD-10-CM | POA: Diagnosis not present

## 2017-11-24 MED ORDER — ATENOLOL 50 MG PO TABS: 50.0000 mg | ORAL_TABLET | Freq: Two times a day (BID) | ORAL | 3 refills | Status: DC

## 2017-11-24 MED ORDER — CYANOCOBALAMIN 1000 MCG/ML IJ SOLN
1000.0000 ug | Freq: Once | INTRAMUSCULAR | Status: AC
  Administered 2017-11-24: 1000 ug via INTRAMUSCULAR

## 2017-11-24 MED ORDER — AMLODIPINE BESYLATE 5 MG PO TABS: 5.0000 mg | ORAL_TABLET | Freq: Every day | ORAL | 3 refills | Status: DC

## 2017-11-24 MED ORDER — LISINOPRIL 40 MG PO TABS: 40.0000 mg | ORAL_TABLET | Freq: Every day | ORAL | 3 refills | Status: DC

## 2017-11-24 MED ORDER — DICLOFENAC SODIUM 1 % TD GEL: TRANSDERMAL | 1 refills | Status: DC

## 2017-11-24 NOTE — Patient Instructions (Signed)
Try to limit saturated fats in your diet (bologna, hot dogs, barbeque, cheeseburgers, hamburgers, steak, bacon, sausage, cheese, etc.) and get more fresh fruits, vegetables, and whole grains Try to follow the DASH guidelines (DASH stands for Dietary Approaches to Stop Hypertension). Try to limit the sodium in your diet to no more than 1,500mg of sodium per day. Certainly try to not exceed 2,000 mg per day at the very most. Do not add salt when cooking or at the table.  Check the sodium amount on labels when shopping, and choose items lower in sodium when given a choice. Avoid or limit foods that already contain a lot of sodium. Eat a diet rich in fruits and vegetables and whole grains, and try to lose weight if overweight or obese  

## 2017-11-24 NOTE — Progress Notes (Signed)
BP 132/68   Pulse 77   Temp 98.5 F (36.9 C) (Oral)   Ht 6\' 1"  (1.854 m)   Wt 224 lb (101.6 kg)   SpO2 95%   BMI 29.55 kg/m    Subjective:    Patient ID: Drew Morgan, male    DOB: [birth year], 77 y.o.   MRN: [id number]  HPI: Drew Morgan is a 77 y.o. male  Chief Complaint  Patient presents with  . Follow-up  . Medication Refill    [employer] states he needs refills    HPI  Patient states voltaren gel was using it but then noted that it was very expensive but states that they sent paperwork to PCP office it. ; OA of the knee, chronic problem  Hypertension Takes lisinopril, amlodipine and atenolol for blood pressure, needs refills today. Chronic problem  BP Readings from Last 3 Encounters:  11/24/17 132/68  09/22/17 138/82  07/24/17 116/62   Hyperlipidemia States is statin intolerant, tried gemfibrozil and was having weakness, shortness of breath and affecting ability to walk. Stopped medication and is doing better. Managed by [facility]. Flu shot at the [place] in November   Depression Has psychiatrist at the [place]; takes effexor 150mg  daily; states doesn't think he will ever find a cure for mental illness, but able to manage. States no thoughts of hurting self or anyone else. Just feels oppressed from family.   Factor V leiden mutation On anticoagulation per [facility]  Depression screen [facility] 2/9 11/24/2017 09/22/2017 07/24/2017 04/06/2017 03/06/2017  Decreased Interest 0 1 0 0 0  Down, Depressed, Hopeless 0 3 1 0 0  PHQ - 2 Score 0 4 1 0 0  Altered sleeping 3 3 - - -  Tired, decreased energy 3 3 - - -  Change in appetite 0 3 - - -  Feeling bad or failure about yourself  3 0 - - -  Trouble concentrating 3 3 - - -  Moving slowly or fidgety/restless 0 0 - - -  Suicidal thoughts 1 0 - - -  PHQ-9 Score 13 16 - - -  Difficult doing work/chores Somewhat difficult Very difficult - - -  MD note: patient declines the "1" for suicidal thoughts  Relevant past medical, surgical, family and  social history reviewed Past Medical History:  Diagnosis Date  . ADHD (attention deficit hyperactivity disorder) 07/30/2015  . Anticoagulation goal of INR 2 to 3 07/30/2015  . Arthritis    shoulder, both knees, hip, back  . Chronic obstructive pulmonary disease (HCC) 09/22/2017  . Coronary artery disease   . Depression   . Diabetes mellitus without complication (HCC)   . Factor V Leiden (HCC)   . History of shingles   . Hyperlipidemia   . Hypertension   . Insomnia   . Intermittent explosive disorder 07/30/2015  . OSA (obstructive sleep apnea)   . Testicular dysfunction    Past Surgical History:  Procedure Laterality Date  . BACK SURGERY    . CORONARY ANGIOPLASTY WITH STENT PLACEMENT  1995  . HEMORRHOID SURGERY    . UVULOPLASTY     Family History  Problem Relation Age of Onset  . Anuerysm Father   . Depression Mother   . Clotting disorder Mother   . Depression Sister        taking venlafaxine  . Cancer Son        lung  . Heart attack Paternal Grandfather   . Depression Sister   . Stroke Brother   .  Heart disease Neg Hx    Social History   Tobacco Use  . Smoking status: Former Smoker    Types: Cigarettes    Last attempt to quit: 01/07/1991    Years since quitting: 26.9  . Smokeless tobacco: Former Neurosurgeon    Types: Chew  . Tobacco comment: social smoker when he did smoke  Substance Use Topics  . Alcohol use: No    Alcohol/week: 0.0 standard drinks  . Drug use: No    Interim medical history since last visit reviewed. Allergies and medications reviewed  Review of Systems Per HPI unless specifically indicated above     Objective:    BP 132/68   Pulse 77   Temp 98.5 F (36.9 C) (Oral)   Ht 6\' 1"  (1.854 m)   Wt 224 lb (101.6 kg)   SpO2 95%   BMI 29.55 kg/m   Wt Readings from Last 3 Encounters:  11/24/17 224 lb (101.6 kg)  09/22/17 224 lb (101.6 kg)  07/24/17 220 lb 8 oz (100 kg)    Physical Exam  Constitutional: He appears well-developed and  well-nourished. No distress.  HENT:  Head: Normocephalic and atraumatic.  Eyes: EOM are normal. No scleral icterus.  Neck: No thyromegaly present.  Cardiovascular: Normal rate and regular rhythm.  Pulmonary/Chest: Effort normal and breath sounds normal.  Abdominal: Soft. Bowel sounds are normal. He exhibits no distension.  Musculoskeletal: He exhibits no edema.  Neurological: Coordination normal.  Skin: Skin is warm and dry. No pallor.  Psychiatric: He has a normal mood and affect. His behavior is normal. Judgment and thought content normal.   Diabetic Foot Form - Detailed   Diabetic Foot Exam - detailed Diabetic Foot exam was performed with the following findings:  Yes 11/24/2017 11:14 AM  Visual Foot Exam completed.:  Yes  Pulse Foot Exam completed.:  Yes  Right Dorsalis Pedis:  Present Left Dorsalis Pedis:  Present  Sensory Foot Exam Completed.:  Yes Semmes-Weinstein Monofilament Test R Site 1-Great Toe:  Pos L Site 1-Great Toe:  Pos        Results for orders placed or performed in visit on 07/24/17  Hemoglobin A1C  Result Value Ref Range   Hgb A1c MFr Bld 7.8 (H) 4.8 - 5.6 %   Est. average glucose Bld gHb Est-mCnc 177 mg/dL  CBC with Differential/Platelet  Result Value Ref Range   WBC 9.8 3.4 - 10.8 x10E3/uL   RBC 4.88 4.14 - 5.80 x10E6/uL   Hemoglobin 15.0 13.0 - 17.7 g/dL   Hematocrit 16.1 09.6 - 51.0 %   MCV 88 79 - 97 fL   MCH 30.7 26.6 - 33.0 pg   MCHC 34.8 31.5 - 35.7 g/dL   RDW 04.5 40.9 - 81.1 %   Platelets 261 150 - 450 x10E3/uL   Neutrophils 50 Not Estab. %   Lymphs 34 Not Estab. %   Monocytes 11 Not Estab. %   Eos 4 Not Estab. %   Basos 1 Not Estab. %   Neutrophils Absolute 4.9 1.4 - 7.0 x10E3/uL   Lymphocytes Absolute 3.4 (H) 0.7 - 3.1 x10E3/uL   Monocytes Absolute 1.1 (H) 0.1 - 0.9 x10E3/uL   EOS (ABSOLUTE) 0.4 0.0 - 0.4 x10E3/uL   Basophils Absolute 0.1 0.0 - 0.2 x10E3/uL   Immature Granulocytes 0 Not Estab. %   Immature Grans (Abs) 0.0 0.0 - 0.1  x10E3/uL  TSH  Result Value Ref Range   TSH 2.480 0.450 - 4.500 uIU/mL  B12  Result Value  Ref Range   Vitamin B-12 506 232 - 1,245 pg/mL  Urine Microalbumin w/creat. ratio  Result Value Ref Range   Creatinine, Urine 109.2 Not Estab. mg/dL   Microalbumin, Urine 7.9 Not Estab. ug/mL   Microalb/Creat Ratio 7.2 0.0 - 30.0 mg/g creat      Assessment & Plan:   Problem List Items Addressed This Visit      Cardiovascular and Mediastinum   Essential hypertension (Chronic)    Controlled today; continue med      Relevant Medications   amLODipine (NORVASC) 5 MG tablet   atenolol (TENORMIN) 50 MG tablet   lisinopril (PRINIVIL,ZESTRIL) 40 MG tablet     Endocrine   Type 2 diabetes, uncontrolled, with neuropathy (HCC) (Chronic)    Foot exam by MD; [facility]      Relevant Medications   lisinopril (PRINIVIL,ZESTRIL) 40 MG tablet     Musculoskeletal and Integument   Arthritis of both knees    Refill voltaren gel        Hematopoietic and Hemostatic   Factor V Leiden mutation (HCC) (Chronic)    Lifelong anticoagulation        Other   Hyperlipidemia (Chronic)   Relevant Medications   amLODipine (NORVASC) 5 MG tablet   atenolol (TENORMIN) 50 MG tablet   lisinopril (PRINIVIL,ZESTRIL) 40 MG tablet   B12 deficiency - Primary   Relevant Medications   cyanocobalamin ((VITAMIN B-12)) injection 1,000 mcg (Completed)   Stress due to family tension    Supportive listening provided; patient has struggled with issues at home, feelings of being rejected; working with therapist and psychiatrist; no SI or HI          Follow up plan: Return in about 1 month (around 12/24/2017) for B12 injection with CMA; 2 months with Dr. Sherie DonLada.  An after-visit summary was printed and given to the patient at check-out.  Please see the patient instructions which may contain other information and recommendations beyond what is mentioned above in the assessment and plan.  Meds ordered this  encounter  Medications  . cyanocobalamin ((VITAMIN B-12)) injection 1,000 mcg  . amLODipine (NORVASC) 5 MG tablet    Sig: Take 1 tablet (5 mg total) by mouth daily.    Dispense:  90 tablet    Refill:  3    Order Specific Question:   Supervising Provider    Answer:   Kinta Martis, Janit BernMELINDA P [id number]]  . atenolol (TENORMIN) 50 MG tablet    Sig: Take 1 tablet (50 mg total) by mouth 2 (two) times daily.    Dispense:  180 tablet    Refill:  3    Order Specific Question:   Supervising Provider    Answer:   Corey Caulfield, Janit BernMELINDA P [id number]]  . lisinopril (PRINIVIL,ZESTRIL) 40 MG tablet    Sig: Take 1 tablet (40 mg total) by mouth daily.    Dispense:  90 tablet    Refill:  3    Order Specific Question:   Supervising Provider    Answer:   Chasady Longwell, Janit BernMELINDA P [id number]]  . diclofenac sodium (VOLTAREN) 1 % GEL    Sig: Apply 2 grams to hands or 4 grams to knees up to 4 times a day; use just where needed    Dispense:  300 g    Refill:  1    Requesting tier exemption to level 2 for arthritis for patient with diabetes and hypertension; thank you    No orders of the defined types were placed  in this encounter.

## 2017-11-30 NOTE — Assessment & Plan Note (Signed)
Lifelong anticoagulation 

## 2017-11-30 NOTE — Assessment & Plan Note (Signed)
Foot exam by MD; [facility]

## 2017-11-30 NOTE — Assessment & Plan Note (Signed)
Supportive listening provided; patient has struggled with issues at home, feelings of being rejected; working with therapist and psychiatrist; no SI or HI

## 2017-11-30 NOTE — Assessment & Plan Note (Signed)
Refill voltaren gel

## 2017-11-30 NOTE — Assessment & Plan Note (Signed)
Controlled today; continue med 

## 2017-12-07 ENCOUNTER — Other Ambulatory Visit: Payer: Self-pay

## 2017-12-07 NOTE — Telephone Encounter (Signed)
I just approved the diclofenac gel on 11/24/17 for a six month supply Please see what is needed

## 2017-12-09 ENCOUNTER — Other Ambulatory Visit: Payer: Self-pay

## 2017-12-18 ENCOUNTER — Telehealth: Payer: Self-pay

## 2017-12-18 NOTE — Telephone Encounter (Signed)
Copied from CRM [id number]. Topic: Quick Communication - See Telephone Encounter >> Dec 18, 2017  3:34 PM Terisa Starr wrote: CRM for notification. See Telephone encounter for: 12/18/17.  Patient states that he wants to speak to the nurse, said he is not disclosing any information to me. Please contact patient.

## 2017-12-18 NOTE — Telephone Encounter (Signed)
Drew Morgan trinadada

## 2017-12-18 NOTE — Telephone Encounter (Signed)
Erronous encounter

## 2017-12-29 ENCOUNTER — Ambulatory Visit: Payer: Medicare HMO

## 2018-01-26 ENCOUNTER — Encounter: Payer: Self-pay | Admitting: Family Medicine

## 2018-01-26 ENCOUNTER — Ambulatory Visit (INDEPENDENT_AMBULATORY_CARE_PROVIDER_SITE_OTHER): Payer: Medicare HMO | Admitting: Family Medicine

## 2018-01-26 VITALS — BP 128/70 | HR 80 | Temp 98.5°F | Ht 73.0 in | Wt 223.9 lb

## 2018-01-26 DIAGNOSIS — E1140 Type 2 diabetes mellitus with diabetic neuropathy, unspecified: Secondary | ICD-10-CM | POA: Diagnosis not present

## 2018-01-26 DIAGNOSIS — E1165 Type 2 diabetes mellitus with hyperglycemia: Secondary | ICD-10-CM

## 2018-01-26 DIAGNOSIS — E538 Deficiency of other specified B group vitamins: Secondary | ICD-10-CM

## 2018-01-26 DIAGNOSIS — Z638 Other specified problems related to primary support group: Secondary | ICD-10-CM | POA: Diagnosis not present

## 2018-01-26 DIAGNOSIS — IMO0002 Reserved for concepts with insufficient information to code with codable children: Secondary | ICD-10-CM

## 2018-01-26 MED ORDER — CYANOCOBALAMIN 1000 MCG/ML IJ SOLN
1000.0000 ug | Freq: Once | INTRAMUSCULAR | Status: AC
  Administered 2018-01-26: 1000 ug via INTRAMUSCULAR

## 2018-01-26 NOTE — Progress Notes (Signed)
BP 128/70   Pulse 80   Temp 98.5 F (36.9 C)   Ht 6\' 1"  (1.854 m)   Wt 223 lb 14.4 oz (101.6 kg)   SpO2 95%   BMI 29.54 kg/m    Subjective:    Patient ID: Drew Morgan, male    DOB: 08/08/1940, 77 y.o.   MRN: [id number]  HPI: Drew Morgan is a 77 y.o. male  Chief Complaint  Patient presents with  . Follow-up    HPI Patient is here for f/u His main issue is psychiatric; [facility], as well as for his factor V leiden, diabetes, hypertension, etc. He returns today for vitamin B12 deficiency and to talk through issues at home He feels rejected; there was an issue with a dog with whom he became attached and he had to give up the dog; this was upsetting to him; he is not interested in another support animal at this time  [facility] 01/26/2018 11/24/2017 09/22/2017 07/24/2017 04/06/2017  Decreased Interest 0 0 1 0 0  Down, Depressed, Hopeless 0 0 3 1 0  PHQ - 2 Score 0 0 4 1 0  Altered sleeping 0 3 3 - -  Tired, decreased energy 0 3 3 - -  Change in appetite 0 0 3 - -  Feeling bad or failure about yourself  0 3 0 - -  Trouble concentrating 0 3 3 - -  Moving slowly or fidgety/restless 0 0 0 - -  Suicidal thoughts 0 1 0 - -  PHQ-9 Score 0 13 16 - -  Difficult doing work/chores Not difficult at all Somewhat difficult Very difficult - -  Some recent data might be hidden   Fall Risk  01/26/2018 11/24/2017 09/22/2017 07/24/2017 04/06/2017  Falls in the past year? 1 Yes Yes Yes No  Number falls in past yr: 1 2 or more 2 or more 2 or more -  Injury with Fall? 0 No No No -    Relevant past medical, surgical, family and social history reviewed Past Medical History:  Diagnosis Date  . ADHD (attention deficit hyperactivity disorder) 07/30/2015  . Anticoagulation goal of INR 2 to 3 07/30/2015  . Arthritis    shoulder, both knees, hip, back  . Chronic obstructive pulmonary disease (HCC) 09/22/2017  . Coronary artery  disease   . Depression   . Diabetes mellitus without complication (HCC)   . Factor V Leiden (HCC)   . History of shingles   . Hyperlipidemia   . Hypertension   . Insomnia   . Intermittent explosive disorder 07/30/2015  . OSA (obstructive sleep apnea)   . Testicular dysfunction    Past Surgical History:  Procedure Laterality Date  . BACK SURGERY    . CORONARY ANGIOPLASTY WITH STENT PLACEMENT  1995  . HEMORRHOID SURGERY    . UVULOPLASTY     Family History  Problem Relation Age of Onset  . Anuerysm Father   . Depression Mother   . Clotting disorder Mother   . Depression Sister        taking venlafaxine  . Cancer Son        lung  . Heart attack Paternal Grandfather   . Depression Sister   . Stroke Brother   . Heart disease Neg Hx    Social History   Tobacco Use  . Smoking status: Former Smoker    Types: Cigarettes  Last attempt to quit: 01/07/1991    Years since quitting: 27.1  . Smokeless tobacco: Former Neurosurgeon    Types: Chew  . Tobacco comment: social smoker when he did smoke  Substance Use Topics  . Alcohol use: No    Alcohol/week: 0.0 standard drinks  . Drug use: No     [facility] Visit from 01/26/2018 in [facility]  AUDIT-C Score  0      Interim medical history since last visit reviewed. Allergies and medications reviewed  Review of Systems Per HPI unless specifically indicated above     Objective:    BP 128/70   Pulse 80   Temp 98.5 F (36.9 C)   Ht 6\' 1"  (1.854 m)   Wt 223 lb 14.4 oz (101.6 kg)   SpO2 95%   BMI 29.54 kg/m   Wt Readings from Last 3 Encounters:  01/26/18 223 lb 14.4 oz (101.6 kg)  11/24/17 224 lb (101.6 kg)  09/22/17 224 lb (101.6 kg)    Physical Exam  Constitutional: He appears well-developed and well-nourished. No distress.  Eyes: No scleral icterus.  Cardiovascular: Normal rate and regular rhythm.  Pulmonary/Chest: Effort normal and breath sounds normal.  Neurological: He is alert.  Skin: No pallor.   Psychiatric: He has a normal mood and affect. His mood appears not anxious. His affect is not angry, not blunt and not inappropriate. His speech is not rapid and/or pressured and not slurred. He is not agitated and not slowed. Thought content is not paranoid and not delusional. He does not exhibit a depressed mood. He expresses no homicidal and no suicidal ideation.  Good eye contact with examiner   Diabetic Foot Form - Detailed   Diabetic Foot Exam - detailed Diabetic Foot exam was performed with the following findings:  Yes 01/26/2018  8:08 PM  Visual Foot Exam completed.:  Yes  Pulse Foot Exam completed.:  Yes  Right Dorsalis Pedis:  Present Left Dorsalis Pedis:  Present  Sensory Foot Exam Completed.:  Yes Semmes-Weinstein Monofilament Test          Assessment & Plan:   Problem List Items Addressed This Visit      Endocrine   Type 2 diabetes, uncontrolled, with neuropathy (HCC) (Chronic)    Managed primarily through the [place]; foot exam by MD today        Other   Stress due to family tension - Primary    Supportive listening provided; patient is not [facility] at [facility]; his faith is important to him      B12 deficiency    B12 1000 mcg IM monthly      Relevant Medications   cyanocobalamin ((VITAMIN B-12)) injection 1,000 mcg (Completed)       Follow up plan: Return in about 2 months (around 03/28/2018) for follow-up visit with Dr. Sherie Don.  An after-visit summary was printed and given to the patient at check-out.  Please see the patient instructions which may contain other information and recommendations beyond what is mentioned above in the assessment and plan.  Meds ordered this encounter  Medications  . cyanocobalamin ((VITAMIN B-12)) injection 1,000 mcg   Face-to-face time with patient was more than 15 minutes, >50% time spent counseling and coordination of care

## 2018-02-06 NOTE — Assessment & Plan Note (Signed)
Supportive listening provided; patient is not interested in pursuing a support [facility] at this time; his faith is important to him

## 2018-02-06 NOTE — Assessment & Plan Note (Signed)
B12 1000 mcg IM monthly

## 2018-02-06 NOTE — Assessment & Plan Note (Signed)
[facility]; foot exam by MD today

## 2018-03-30 ENCOUNTER — Ambulatory Visit (INDEPENDENT_AMBULATORY_CARE_PROVIDER_SITE_OTHER): Payer: Medicare HMO | Admitting: Family Medicine

## 2018-03-30 ENCOUNTER — Encounter: Payer: Self-pay | Admitting: Family Medicine

## 2018-03-30 VITALS — BP 130/68 | HR 69 | Temp 98.7°F | Ht 73.0 in | Wt 226.6 lb

## 2018-03-30 DIAGNOSIS — E538 Deficiency of other specified B group vitamins: Secondary | ICD-10-CM

## 2018-03-30 DIAGNOSIS — F6381 Intermittent explosive disorder: Secondary | ICD-10-CM

## 2018-03-30 DIAGNOSIS — Z638 Other specified problems related to primary support group: Secondary | ICD-10-CM

## 2018-03-30 MED ORDER — CYANOCOBALAMIN 1000 MCG/ML IJ SOLN
1000.0000 ug | Freq: Once | INTRAMUSCULAR | Status: AC
  Administered 2018-03-30: 1000 ug via INTRAMUSCULAR

## 2018-03-30 NOTE — Progress Notes (Signed)
BP 130/68   Pulse 69   Temp 98.7 F (37.1 C)   Ht 6\' 1"  (1.854 m)   Wt 226 lb 9.6 oz (102.8 kg)   SpO2 97%   BMI 29.90 kg/m    Subjective:    Patient ID: Drew Morgan, male    DOB: 11/19/1940, 78 y.o.   MRN: [id number]  HPI: Drew SaucierRobert M Ahner is a 78 y.o. male  Chief Complaint  Patient presents with  . Follow-up    HPI Patient is here for follow-up of intermittent explosive disorder and depression [facility], high [facility], Factor V leiden He finds support coming here every two months to check in and also get  B12 shots He continues to struggle with family issues He feels isolated and rejected His faith is important to him He recalled having a support animal for a brief time which brought him a lot of comfort, but then the dog was taken away He denies SI/HI His issues have been going on for many years [facility]  Dep[facility] 2/[facility] 03/30/2018 01/26/2018 11/24/2017 09/22/2017 07/24/2017  Decreased Interest 0 0 0 1 0  Down, Depressed, Hopeless 0 0 0 3 1  PHQ - 2 Score 0 0 0 4 1  Altered sleeping 0 0 3 3 -  Tired, decreased energy 0 0 3 3 -  Change in appetite 0 0 0 3 -  Feeling bad or failure about yourself  0 0 3 0 -  Trouble concentrating 0 0 3 3 -  Moving slowly or fidgety/restless 0 0 0 0 -  Suicidal thoughts 0 0 1 0 -  PHQ-9 Score 0 0 13 16 -  Difficult doing work/chores Not difficult at all Not difficult at all Somewhat difficult Very difficult -  Some recent data might be hidden   Fall Risk  03/30/2018 01/26/2018 11/24/2017 09/22/2017 07/24/2017  Falls in the past year? 1 1 Yes Yes Yes  Number falls in past yr: 1 1 2  or more 2 or more 2 or more  Injury with Fall? 0 0 No No No    Relevant past medical, surgical, family and social history reviewed Past Medical History:  Diagnosis Date  . ADHD (attention deficit hyperactivity disorder) 07/30/2015  . Anticoagulation goal of INR 2 to 3  07/30/2015  . Arthritis    shoulder, both knees, hip, back  . Chronic obstructive pulmonary disease (HCC) 09/22/2017  . Coronary artery disease   . Depression   . Diabetes mellitus without complication (HCC)   . Factor V Leiden (HCC)   . History of shingles   . Hyperlipidemia   . Hypertension   . Insomnia   . Intermittent explosive disorder 07/30/2015  . OSA (obstructive sleep apnea)   . Testicular dysfunction    Past Surgical History:  Procedure Laterality Date  . BACK SURGERY    . CORONARY ANGIOPLASTY WITH STENT PLACEMENT  1995  . HEMORRHOID SURGERY    . UVULOPLASTY     Family History  Problem Relation Age of Onset  . Anuerysm Father   . Depression Mother   . Clotting disorder Mother   . Depression Sister        taking venlafaxine  . Cancer Son        lung  . Heart attack Paternal Grandfather   . Depression Sister   . Stroke Brother   . Heart disease Neg Hx  Social History   Tobacco Use  . Smoking status: Former Smoker    Types: Cigarettes    Last attempt to quit: 01/07/1991    Years since quitting: 27.2  . Smokeless tobacco: Former NeurosurgeonUser    Types: Chew  . Tobacco comment: social smoker when he did smoke  Substance Use Topics  . Alcohol use: No    Alcohol/week: 0.0 standard drinks  . Drug use: No     [facility]  AUDIT-C Score  0      Interim medical history since last visit reviewed. Allergies and medications reviewed  Review of Systems Per HPI unless specifically indicated above     Objective:    BP 130/68   Pulse 69   Temp 98.7 F (37.1 C)   Ht 6\' 1"  (1.854 m)   Wt 226 lb 9.6 oz (102.8 kg)   SpO2 97%   BMI 29.90 kg/m   Wt Readings from Last 3 Encounters:  03/30/18 226 lb 9.6 oz (102.8 kg)  01/26/18 223 lb 14.4 oz (101.6 kg)  11/24/17 224 lb (101.6 kg)    Physical Exam Constitutional:      General: He is not in acute distress.    Appearance: He is well-developed.  Eyes:      General: No scleral icterus. Cardiovascular:     Rate and Rhythm: Normal rate and regular rhythm.  Pulmonary:     Effort: Pulmonary effort is normal.     Breath sounds: Normal breath sounds.  Neurological:     Mental Status: He is alert.  Psychiatric:        Attention and Perception: Attention normal.        Mood and Affect: Mood is depressed. Mood is not anxious. Affect is not labile, blunt, angry or inappropriate.        Speech: Speech is not rapid and pressured or delayed.        Behavior: Behavior is not agitated, slowed, aggressive or withdrawn.        Thought Content: Thought content does not include homicidal or suicidal ideation.        Cognition and Memory: Cognition and memory normal.        Assessment & Plan:   Problem List Items Addressed This Visit      Other   B12 deficiency   Relevant Medications   cyanocobalamin ((VITAMIN B-12)) injection 1,000 mcg (Completed)   Stress due to family tension - Primary    Supportive listening provided; patient will continue to [facility]; discussed the support animal issue; I would be glad to write a letter if needed in favor of allowing him to have a support animal; his faith is important to him; I am here for f/u visits every two months if he finds them useful, otherwise it is okay with me to decrease frequency of visits to as he finds helpful      Intermittent explosive disorder (Chronic)    Chronic ongoing issue; [facility]          Follow up plan: No follow-ups on file.  An after-visit summary was printed and given to the patient at check-out.  Please see the patient instructions which may contain other information and recommendations beyond what is mentioned above in the assessment and plan.  Meds ordered this encounter  Medications  . cyanocobalamin ((VITAMIN B-12)) injection 1,000 mcg    No orders of  the defined types were placed in this  encounter.  Face-to-face time with patient was more than 15 minutes, >50% time spent counseling and coordination of care

## 2018-04-08 NOTE — Assessment & Plan Note (Signed)
Supportive listening provided; patient will continue to work with psychologist and psychiatrist at the [place]; discussed the support animal issue; I would be glad to write a letter if needed in favor of allowing him to have a support animal; his faith is important to him; I am here for f/u visits every two months if he finds them useful, otherwise it is okay with me to decrease frequency of visits to as he finds helpful

## 2018-04-08 NOTE — Assessment & Plan Note (Signed)
Chronic ongoing issue; [facility]

## 2018-05-25 ENCOUNTER — Ambulatory Visit: Payer: Medicare HMO | Admitting: Family Medicine

## 2018-06-04 ENCOUNTER — Encounter: Payer: Self-pay | Admitting: Family Medicine

## 2018-06-04 ENCOUNTER — Telehealth: Payer: Self-pay

## 2018-06-04 NOTE — Telephone Encounter (Signed)
Please let him know that I'll be very glad to write a letter for him. I'll have it ready in a few minutes and it can be mailed to him (without a wet signature). Thank you

## 2018-06-04 NOTE — Telephone Encounter (Signed)
Pt is asking about the letter discussed at his last appt with Dr. Sherie Don in regards to a companion dog.    Copied from CRM [id number]. Topic: General - Other >> Jun 03, 2018  2:26 PM Drew Morgan wrote: Reason for CRM:   Pt called and left voicemail on [facility] mailbox on 06/02/2018.  States that he needs to speak with Dr. Marlise Eves nurse and left call back number of [phone]

## 2018-06-23 ENCOUNTER — Ambulatory Visit (INDEPENDENT_AMBULATORY_CARE_PROVIDER_SITE_OTHER): Payer: Medicare HMO

## 2018-06-23 DIAGNOSIS — E538 Deficiency of other specified B group vitamins: Secondary | ICD-10-CM | POA: Diagnosis not present

## 2018-06-23 MED ORDER — CYANOCOBALAMIN 1000 MCG/ML IJ SOLN
1000.0000 ug | Freq: Once | INTRAMUSCULAR | Status: AC
  Administered 2018-06-23: 1000 ug via INTRAMUSCULAR

## 2018-09-07 ENCOUNTER — Telehealth: Payer: Self-pay | Admitting: Family Medicine

## 2018-09-07 NOTE — Telephone Encounter (Signed)
Medication Refill - Medication: amLODipine (NORVASC) 5 MG tablet,atenolol (TENORMIN) 50 MG tablet, lisinopril (PRINIVIL,ZESTRIL) 40 MG tablet (Patient [facility].)     Has the patient contacted their pharmacy? Yes  (Agent: If no, request that the patient contact the pharmacy for the refill.) (Agent: If yes, when and what did the pharmacy advise?)Contact PCP  Preferred Pharmacy (with phone number or street name):  [place], [place] [phone] (Phone) [phone] (Fax)     Agent: Please be advised that RX refills may take up to 3 business days. We ask that you follow-up with your pharmacy.

## 2018-09-08 ENCOUNTER — Ambulatory Visit (INDEPENDENT_AMBULATORY_CARE_PROVIDER_SITE_OTHER): Payer: Medicare HMO | Admitting: Nurse Practitioner

## 2018-09-08 ENCOUNTER — Encounter: Payer: Self-pay | Admitting: Nurse Practitioner

## 2018-09-08 ENCOUNTER — Other Ambulatory Visit: Payer: Self-pay

## 2018-09-08 ENCOUNTER — Ambulatory Visit: Payer: Medicare HMO

## 2018-09-08 VITALS — BP 114/62 | HR 88 | Temp 97.6°F | Resp 14 | Ht 73.0 in | Wt 226.8 lb

## 2018-09-08 DIAGNOSIS — E1140 Type 2 diabetes mellitus with diabetic neuropathy, unspecified: Secondary | ICD-10-CM | POA: Diagnosis not present

## 2018-09-08 DIAGNOSIS — I2510 Atherosclerotic heart disease of native coronary artery without angina pectoris: Secondary | ICD-10-CM

## 2018-09-08 DIAGNOSIS — I10 Essential (primary) hypertension: Secondary | ICD-10-CM | POA: Diagnosis not present

## 2018-09-08 DIAGNOSIS — D692 Other nonthrombocytopenic purpura: Secondary | ICD-10-CM

## 2018-09-08 DIAGNOSIS — J449 Chronic obstructive pulmonary disease, unspecified: Secondary | ICD-10-CM

## 2018-09-08 DIAGNOSIS — G4733 Obstructive sleep apnea (adult) (pediatric): Secondary | ICD-10-CM | POA: Diagnosis not present

## 2018-09-08 DIAGNOSIS — D6851 Activated protein C resistance: Secondary | ICD-10-CM

## 2018-09-08 DIAGNOSIS — E538 Deficiency of other specified B group vitamins: Secondary | ICD-10-CM | POA: Diagnosis not present

## 2018-09-08 DIAGNOSIS — E1165 Type 2 diabetes mellitus with hyperglycemia: Secondary | ICD-10-CM

## 2018-09-08 DIAGNOSIS — E782 Mixed hyperlipidemia: Secondary | ICD-10-CM

## 2018-09-08 DIAGNOSIS — IMO0002 Reserved for concepts with insufficient information to code with codable children: Secondary | ICD-10-CM

## 2018-09-08 MED ORDER — LISINOPRIL 40 MG PO TABS: 40.0000 mg | ORAL_TABLET | Freq: Every day | ORAL | 3 refills | Status: DC

## 2018-09-08 MED ORDER — NITROGLYCERIN 0.4 MG SL SUBL: 0.4000 mg | SUBLINGUAL_TABLET | SUBLINGUAL | 1 refills | Status: DC | PRN

## 2018-09-08 MED ORDER — CYANOCOBALAMIN 1000 MCG/ML IJ SOLN
1000.0000 ug | Freq: Once | INTRAMUSCULAR | Status: AC
  Administered 2018-09-08: 1000 ug via INTRAMUSCULAR

## 2018-09-08 MED ORDER — AMLODIPINE BESYLATE 5 MG PO TABS: 5.0000 mg | ORAL_TABLET | Freq: Every day | ORAL | 3 refills | Status: DC

## 2018-09-08 MED ORDER — ATENOLOL 50 MG PO TABS: 50.0000 mg | ORAL_TABLET | Freq: Two times a day (BID) | ORAL | 3 refills | Status: DC

## 2018-09-08 NOTE — Patient Instructions (Signed)

## 2018-09-08 NOTE — Progress Notes (Signed)
Name: Drew Morgan   MRN: [id number]    DOB: [birth year]   Date:09/08/2018       Progress Note  Subjective  Chief Complaint  Chief Complaint  Patient presents with  . Consult  . Medication Refill    HPI  Hypertension Patient is on lisinopril 11m, amlodipine 576mdaily and atenolol 5076mID  Takes medications as prescribed with no missed doses a month.  He is not compliant with low-salt diet.  Does checks blood pressures at home with range of 125-140-70's when he resting  Denies chest pain, headaches, blurry vision.  [facility], but gets labs here that are faxed to them. States he has been eating poorly and is sure his a1c is up Denies polyphagia, polydipsia, polyuria. Does daily foot checks Sees eye doctors Is on ACEi, cannot tolerate statin Lab Results  Component Value Date   HGBA1C 7.8 (H) 07/28/2017     Hyperlipidemia States he has tried multiple statins and zetia but states they are so crippling to him, he cannot tolerate it. Does not take fishoil supplements. States just eats whatever he wants. Does not smoke- quite in 1992 Lab Results  Component Value Date   CHOL 245 (A) 02/25/2017   HDL 37 02/25/2017   LDLCALC 142 02/25/2017   TRIG 332 (A) 02/25/2017   CHOLHDL 7.9 (H) 11/03/2016    COPD Was prescribed Symbicort 2 puffs BID but states he doesn't feel like he needs it so he has been off of it for over 6 months- states will take it if he ever feels he needs it.  Denies shortness of breath, wheezing, cough  OSA Has new cpap- has been trying to wear it more often and is working with company to get properly fitting mask.   Factor V Leiden  Takes xarelto 29m71mily denies dark tarry stools, occasionally noticed bright red blood with hemorrhoids but nothing recently.   Depression States is at his baseline despite elevated phq9- states he takes effexor and [facility] [place]: Depression screen PHQ [facility]  09/08/2018 03/30/2018 01/26/2018 11/24/2017 09/22/2017  Decreased Interest 3 0 0 0 1  Down, Depressed, Hopeless 3 0 0 0 3  PHQ - 2 Score 6 0 0 0 4  Altered sleeping 0 0 0 3 3  Tired, decreased energy 0 0 0 3 3  Change in appetite 0 0 0 0 3  Feeling bad or failure about yourself  0 0 0 3 0  Trouble concentrating 0 0 0 3 3  Moving slowly or fidgety/restless 0 0 0 0 0  Suicidal thoughts 0 0 0 1 0  PHQ-9 Score 6 0 0 13 16  Difficult doing work/chores Not difficult at all Not difficult at all Not difficult at all Somewhat difficult Very difficult  Some recent data might be hidden     PHQ reviewed. Positive  Patient Active Problem List   Diagnosis Date Noted  . Chronic obstructive pulmonary disease (HCC)[place]/16/2019  . B12 deficiency 01/02/2017  . Memory difficulties 11/03/2016  . Stress due to family tension 07/02/2016  . Elevated serum glutamic pyruvic transaminase (SGPT) level 04/01/2016  . Coronary artery disease   . OSA (obstructive sleep apnea)   . Intermittent explosive disorder 07/30/2015  . ADHD (attention deficit hyperactivity disorder) 07/30/2015  . Arthritis of both knees 07/30/2015  . Type 2 diabetes, uncontrolled, with neuropathy (HCC)[place]/05/2014  . Essential hypertension 10/11/2014  . Hyperlipidemia  10/11/2014  . Factor V Leiden mutation ([place]) 10/11/2014    Past Medical History:  Diagnosis Date  . ADHD (attention deficit hyperactivity disorder) 07/30/2015  . Anticoagulation goal of INR 2 to 3 07/30/2015  . Arthritis    shoulder, both knees, hip, back  . Chronic obstructive pulmonary disease ([place]) 09/22/2017  . Coronary artery disease   . Depression   . Diabetes mellitus without complication ([place])   . Factor V Leiden ([place])   . History of shingles   . Hyperlipidemia   . Hypertension   . Insomnia   . Intermittent explosive disorder 07/30/2015  . OSA (obstructive sleep apnea)   . Testicular dysfunction     Past Surgical History:  Procedure Laterality Date  . BACK  SURGERY    . CORONARY ANGIOPLASTY WITH STENT PLACEMENT  1995  . HEMORRHOID SURGERY    . UVULOPLASTY      Social History   Tobacco Use  . Smoking status: Former Smoker    Types: Cigarettes    Quit date: 01/07/1991    Years since quitting: 27.6  . Smokeless tobacco: Former Systems developer    Types: Chew  . Tobacco comment: social smoker when he did smoke  Substance Use Topics  . Alcohol use: No    Alcohol/week: 0.0 standard drinks     Current Outpatient Medications:  .  amLODipine (NORVASC) 5 MG tablet, Take 1 tablet (5 mg total) by mouth daily., Disp: 90 tablet, Rfl: 3 .  atenolol (TENORMIN) 50 MG tablet, Take 1 tablet (50 mg total) by mouth 2 (two) times daily., Disp: 180 tablet, Rfl: 3 .  diclofenac sodium (VOLTAREN) 1 % GEL, Apply 2 grams to hands or 4 grams to knees up to 4 times a day; use just where needed, Disp: 300 g, Rfl: 1 .  insulin NPH-regular Human (NOVOLIN 70/30) (70-30) 100 UNIT/ML injection, Inject 90 Units into the skin 2 (two) times daily with a meal. , Disp: , Rfl:  .  lisinopril (ZESTRIL) 40 MG tablet, Take 1 tablet (40 mg total) by mouth daily., Disp: 90 tablet, Rfl: 3 .  mupirocin ointment (BACTROBAN) 2 %, APPLY TO AFFECTED AREA TWICE A DAY, Disp: , Rfl: 0 .  nitroGLYCERIN (NITROSTAT) 0.4 MG SL tablet, Place 1 tablet (0.4 mg total) under the tongue every 5 (five) minutes as needed for chest pain. Up to 3 doses; call 911, Disp: 25 tablet, Rfl: 1 .  rivaroxaban (XARELTO) 20 MG TABS tablet, Take 20 mg by mouth daily. , Disp: , Rfl:  .  venlafaxine XR (EFFEXOR-XR) 150 MG 24 hr capsule, Take 150 mg by mouth daily with breakfast., Disp: , Rfl:  .  ACCU-CHEK FASTCLIX LANCETS MISC, TEST BLOOD SUGAR THREE TIMES DAILY, Disp: 306 each, Rfl: 3 .  Alcohol Swabs (B-D SINGLE USE SWABS REGULAR) PADS, , Disp: , Rfl:  .  Blood Glucose Monitoring Suppl (ACCU-CHEK NANO SMARTVIEW) w/Device KIT, , Disp: , Rfl:  .  glucose blood test strip, Test three times a day; Dx E11.65, Disp: 100 each,  Rfl: 11 .  Insulin Pen Needle (B-D ULTRAFINE III SHORT PEN) 31G X 8 MM MISC, For use with insulin pen, use once a day; LON 99 months; Dx E11.65, Disp: 100 each, Rfl: 3  Allergies  Allergen Reactions  . Statins Other (See Comments)    Review of Systems  Constitutional: Negative for chills, fever and malaise/fatigue.  HENT: Negative for congestion, sinus pain and sore throat.   Eyes: Negative for blurred vision.  Respiratory:  Negative for cough and shortness of breath.   Cardiovascular: Negative for chest pain, palpitations and leg swelling.  Gastrointestinal: Negative for abdominal pain, constipation, diarrhea and nausea.  Genitourinary: Negative for dysuria.  Musculoskeletal: Negative for falls.  Skin: Positive for rash (small bug bite- treat with cortisone).  Neurological: Negative for dizziness and headaches.  Endo/Heme/Allergies: Negative for polydipsia.  Psychiatric/Behavioral: Positive for depression. Negative for suicidal ideas. The patient is not nervous/anxious and does not have insomnia.       No other specific complaints in a complete review of systems (except as listed in HPI above).  Objective  Vitals:   09/08/18 1345 09/08/18 1407  BP: (!) 142/62 114/62  Pulse: 88   Resp: 14   Temp: 97.6 F (36.4 C)   SpO2: 98%   Weight: 226 lb 12.8 oz (102.9 kg)   Height: '6\' 1"'  (1.854 m)     Body mass index is 29.92 kg/m.  Nursing Note and Vital Signs reviewed.  .Physical Exam   Constitutional: Patient appears well-developed and well-nourished. Obese. No distress.  HEENT: head atraumatic, normocephalic, conjunctive clear Cardiovascular: Normal rate,  No BLE edema. Pulmonary/Chest: Effort normall. No respiratory distress. Skin: no concerning rashes, small red raised bumps on left leg. Bilateral upper arm senile purpura- takes xarelto  Psychiatric: Patient has a normal mood and affect. behavior is normal. Judgment and thought content normal.   Hands off exam due to  pandemic and no acute concerns.     No results found for this or any previous visit (from the past 48 hour(s)).  Assessment & Plan  1. Type 2 diabetes, uncontrolled, with neuropathy ([place]) [facility] - HgB A1c - Comprehensive Metabolic Panel (CMET)  2. B12 deficiency - cyanocobalamin ((VITAMIN B-12)) injection 1,000 mcg  3. Essential hypertension stable - lisinopril (ZESTRIL) 40 MG tablet; Take 1 tablet (40 mg total) by mouth daily.  Dispense: 90 tablet; Refill: 3 - amLODipine (NORVASC) 5 MG tablet; Take 1 tablet (5 mg total) by mouth daily.  Dispense: 90 tablet; Refill: 3 - atenolol (TENORMIN) 50 MG tablet; Take 1 tablet (50 mg total) by mouth 2 (two) times daily.  Dispense: 180 tablet; Refill: 3 - Comprehensive Metabolic Panel (CMET)  4. Coronary artery disease involving native coronary artery of native heart without angina pectoris - nitroGLYCERIN (NITROSTAT) 0.4 MG SL tablet; Place 1 tablet (0.4 mg total) under the tongue every 5 (five) minutes as needed for chest pain. Up to 3 doses; call 911  Dispense: 25 tablet; Refill: 1  5. OSA (obstructive sleep apnea) - CBC with Differential  6. Chronic obstructive pulmonary disease, unspecified COPD type (HCC) Stable, albuterol PRN  - CBC with Differential  7. Mixed hyperlipidemia Recommend diet change  - Lipid Profile  8. Factor V Leiden mutation ([place]) Takes xarelto- [facility]  9. Senile purpura ([place]) Reassurance.

## 2018-09-13 DIAGNOSIS — I10 Essential (primary) hypertension: Secondary | ICD-10-CM | POA: Diagnosis not present

## 2018-09-13 DIAGNOSIS — G4733 Obstructive sleep apnea (adult) (pediatric): Secondary | ICD-10-CM | POA: Diagnosis not present

## 2018-09-13 DIAGNOSIS — E1165 Type 2 diabetes mellitus with hyperglycemia: Secondary | ICD-10-CM | POA: Diagnosis not present

## 2018-09-13 DIAGNOSIS — E1140 Type 2 diabetes mellitus with diabetic neuropathy, unspecified: Secondary | ICD-10-CM | POA: Diagnosis not present

## 2018-09-13 DIAGNOSIS — J449 Chronic obstructive pulmonary disease, unspecified: Secondary | ICD-10-CM | POA: Diagnosis not present

## 2018-09-13 DIAGNOSIS — E782 Mixed hyperlipidemia: Secondary | ICD-10-CM | POA: Diagnosis not present

## 2018-09-14 ENCOUNTER — Other Ambulatory Visit: Payer: Self-pay | Admitting: Nurse Practitioner

## 2018-09-14 DIAGNOSIS — E782 Mixed hyperlipidemia: Secondary | ICD-10-CM

## 2018-09-14 LAB — COMPREHENSIVE METABOLIC PANEL
ALT: 46 IU/L — ABNORMAL HIGH (ref 0–44)
AST: 69 IU/L — ABNORMAL HIGH (ref 0–40)
Albumin/Globulin Ratio: 1.6 (ref 1.2–2.2)
Albumin: 4.5 g/dL (ref 3.7–4.7)
Alkaline Phosphatase: 74 IU/L (ref 39–117)
BUN/Creatinine Ratio: 16 (ref 10–24)
BUN: 18 mg/dL (ref 8–27)
Bilirubin Total: 1.7 mg/dL — ABNORMAL HIGH (ref 0.0–1.2)
CO2: 23 mmol/L (ref 20–29)
Calcium: 9.5 mg/dL (ref 8.6–10.2)
Chloride: 102 mmol/L (ref 96–106)
Creatinine, Ser: 1.15 mg/dL (ref 0.76–1.27)
GFR calc Af Amer: 72 mL/min/{1.73_m2} (ref >59)
GFR calc non Af Amer: 62 mL/min/{1.73_m2} (ref >59)
Globulin, Total: 2.8 g/dL (ref 1.5–4.5)
Glucose: 113 mg/dL — ABNORMAL HIGH (ref 65–99)
Potassium: 4.7 mmol/L (ref 3.5–5.2)
Sodium: 141 mmol/L (ref 134–144)
Total Protein: 7.3 g/dL (ref 6.0–8.5)

## 2018-09-14 LAB — CBC WITH DIFFERENTIAL/PLATELET
Basophils Absolute: 0.1 10*3/uL (ref 0.0–0.2)
Basos: 1 %
EOS (ABSOLUTE): 0.3 10*3/uL (ref 0.0–0.4)
Eos: 3 %
Hematocrit: 47.3 % (ref 37.5–51.0)
Hemoglobin: 16 g/dL (ref 13.0–17.7)
Immature Grans (Abs): 0 10*3/uL (ref 0.0–0.1)
Immature Granulocytes: 0 %
Lymphocytes Absolute: 3.8 10*3/uL — ABNORMAL HIGH (ref 0.7–3.1)
Lymphs: 36 %
MCH: 30.3 pg (ref 26.6–33.0)
MCHC: 33.8 g/dL (ref 31.5–35.7)
MCV: 90 fL (ref 79–97)
Monocytes Absolute: 1 10*3/uL — ABNORMAL HIGH (ref 0.1–0.9)
Monocytes: 10 %
Neutrophils Absolute: 5.3 10*3/uL (ref 1.4–7.0)
Neutrophils: 50 %
Platelets: 240 10*3/uL (ref 150–450)
RBC: 5.28 x10E6/uL (ref 4.14–5.80)
RDW: 12.9 % (ref 11.6–15.4)
WBC: 10.5 10*3/uL (ref 3.4–10.8)

## 2018-09-14 LAB — LIPID PANEL
Chol/HDL Ratio: 7.5 ratio — ABNORMAL HIGH (ref 0.0–5.0)
Cholesterol, Total: 271 mg/dL — ABNORMAL HIGH (ref 100–199)
HDL: 36 mg/dL — ABNORMAL LOW (ref >39)
LDL Calculated: 166 mg/dL — ABNORMAL HIGH (ref 0–99)
Triglycerides: 344 mg/dL — ABNORMAL HIGH (ref 0–149)
VLDL Cholesterol Cal: 69 mg/dL — ABNORMAL HIGH (ref 5–40)

## 2018-09-14 LAB — HEMOGLOBIN A1C
Est. average glucose Bld gHb Est-mCnc: 171 mg/dL
Hgb A1c MFr Bld: 7.6 % — ABNORMAL HIGH (ref 4.8–5.6)

## 2018-09-21 NOTE — Telephone Encounter (Signed)
Pt does not get his [facility] from [facility]. Please resend to [facility]. Please call or sent message through mychart  once rxs has been sent to [facility].

## 2018-09-23 MED ORDER — LISINOPRIL 40 MG PO TABS: 40.0000 mg | ORAL_TABLET | Freq: Every day | ORAL | 3 refills | Status: AC

## 2018-09-23 MED ORDER — ATENOLOL 50 MG PO TABS: 50.0000 mg | ORAL_TABLET | Freq: Two times a day (BID) | ORAL | 3 refills | Status: AC

## 2018-09-23 MED ORDER — NITROGLYCERIN 0.4 MG SL SUBL: 0.4000 mg | SUBLINGUAL_TABLET | SUBLINGUAL | 1 refills | Status: AC | PRN

## 2018-09-23 MED ORDER — AMLODIPINE BESYLATE 5 MG PO TABS: 5.0000 mg | ORAL_TABLET | Freq: Every day | ORAL | 3 refills | Status: AC

## 2018-09-23 NOTE — Addendum Note (Signed)
Addended by: Fredderick Severance on: 09/23/2018 05:11 PM   Modules accepted: Orders

## 2018-10-12 ENCOUNTER — Ambulatory Visit: Payer: Medicare HMO

## 2018-11-10 ENCOUNTER — Telehealth: Payer: Self-pay | Admitting: Family Medicine

## 2018-11-10 NOTE — Chronic Care Management (AMB) (Signed)
°  Chronic Care Management   Outreach Note  11/10/2018 Name: Drew Morgan MRN: [id number] DOB: 1940-05-31  Referred by: Arnetha Courser, MD Reason for referral : Chronic Care Management (Initial CCM outreach was unsuccessful.)   An unsuccessful telephone outreach was attempted today. The patient was referred to the case management team by for assistance with chronic care management and care coordination.   Follow Up Plan: The care management team will reach out to the patient again over the next 7 days.   Kell  [email or web address]   ??[phone]

## 2018-11-19 ENCOUNTER — Other Ambulatory Visit: Payer: Self-pay

## 2018-11-19 ENCOUNTER — Ambulatory Visit (INDEPENDENT_AMBULATORY_CARE_PROVIDER_SITE_OTHER): Payer: Medicare HMO | Admitting: Family Medicine

## 2018-11-19 ENCOUNTER — Encounter: Payer: Self-pay | Admitting: Family Medicine

## 2018-11-19 VITALS — BP 118/72 | HR 72 | Temp 97.5°F | Resp 14 | Wt 224.8 lb

## 2018-11-19 DIAGNOSIS — E538 Deficiency of other specified B group vitamins: Secondary | ICD-10-CM

## 2018-11-19 DIAGNOSIS — M25422 Effusion, left elbow: Secondary | ICD-10-CM | POA: Diagnosis not present

## 2018-11-19 MED ORDER — CYANOCOBALAMIN 1000 MCG/ML IJ SOLN
1000.0000 ug | Freq: Once | INTRAMUSCULAR | Status: AC
  Administered 2018-11-19: 1000 ug via INTRAMUSCULAR

## 2018-11-19 NOTE — Progress Notes (Signed)
Patient ID: Drew Morgan, male    DOB: 1940-08-01, 78 y.o.   MRN: [id number]  PCP: Arnetha Courser, MD  Chief Complaint  Patient presents with  . Elbow Problem    possible fluid on left    Subjective:   Drew Morgan is a 78 y.o. male, presents to clinic with CC of the following:   Left elbow swelling and pain s/p injury about 4 months ago.  He hit it on his "gator" and it initially swelled up and then it gradually went down.  At least a month ago he woke up one morning with it becoming gradually more swollen with feels very soft and full of fluid and is gradually worsened.  He does have pain over his elbow.  He denies any redness heat fever any pain with moving his arm or elbow joint.  He has not seek evaluation for this prior to today No xrays done, he's been applying topical cream for pain, it doesn't really help.  He "sneaks" aleve, although he is not supposed to take it. He says of I refer him to orthopedics he has to get it approved through the [place] and go see their specialist.       Patient Active Problem List   Diagnosis Date Noted  . Senile purpura ([place]) 09/08/2018  . Chronic obstructive pulmonary disease ([place]) 09/22/2017  . B12 deficiency 01/02/2017  . Memory difficulties 11/03/2016  . Stress due to family tension 07/02/2016  . Elevated serum glutamic pyruvic transaminase (SGPT) level 04/01/2016  . Coronary artery disease   . OSA (obstructive sleep apnea)   . Intermittent explosive disorder 07/30/2015  . ADHD (attention deficit hyperactivity disorder) 07/30/2015  . Arthritis of both knees 07/30/2015  . Type 2 diabetes, uncontrolled, with neuropathy ([place]) 10/11/2014  . Essential hypertension 10/11/2014  . Hyperlipidemia 10/11/2014  . Factor V Leiden mutation ([place]) 10/11/2014      Current Outpatient Medications:  .  ACCU-CHEK FASTCLIX LANCETS MISC, TEST BLOOD SUGAR THREE TIMES DAILY, Disp: 306 each, Rfl: 3 .  Alcohol Swabs (B-D SINGLE USE SWABS REGULAR)  PADS, , Disp: , Rfl:  .  amLODipine (NORVASC) 5 MG tablet, Take 1 tablet (5 mg total) by mouth daily., Disp: 90 tablet, Rfl: 3 .  atenolol (TENORMIN) 50 MG tablet, Take 1 tablet (50 mg total) by mouth 2 (two) times daily., Disp: 180 tablet, Rfl: 3 .  Blood Glucose Monitoring Suppl (ACCU-CHEK NANO SMARTVIEW) w/Device KIT, , Disp: , Rfl:  .  glucose blood test strip, Test three times a day; Dx E11.65, Disp: 100 each, Rfl: 11 .  insulin NPH-regular Human (NOVOLIN 70/30) (70-30) 100 UNIT/ML injection, Inject 90 Units into the skin 2 (two) times daily with a meal. , Disp: , Rfl:  .  Insulin Pen Needle (B-D ULTRAFINE III SHORT PEN) 31G X 8 MM MISC, For use with insulin pen, use once a day; LON 99 months; Dx E11.65, Disp: 100 each, Rfl: 3 .  lisinopril (ZESTRIL) 40 MG tablet, Take 1 tablet (40 mg total) by mouth daily., Disp: 90 tablet, Rfl: 3 .  rivaroxaban (XARELTO) 20 MG TABS tablet, Take 20 mg by mouth daily. , Disp: , Rfl:  .  venlafaxine XR (EFFEXOR-XR) 150 MG 24 hr capsule, Take 150 mg by mouth daily with breakfast., Disp: , Rfl:  .  diclofenac sodium (VOLTAREN) 1 % GEL, Apply 2 grams to hands or 4 grams to knees up to 4 times a day; use just where needed (  Patient not taking: Reported on 11/19/2018), Disp: 300 g, Rfl: 1 .  mupirocin ointment (BACTROBAN) 2 %, APPLY TO AFFECTED AREA TWICE A DAY, Disp: , Rfl: 0 .  nitroGLYCERIN (NITROSTAT) 0.4 MG SL tablet, Place 1 tablet (0.4 mg total) under the tongue every 5 (five) minutes as needed for chest pain. Up to 3 doses; call 911 (Patient not taking: Reported on 11/19/2018), Disp: 25 tablet, Rfl: 1   Allergies  Allergen Reactions  . Statins Other (See Comments)     Family History  Problem Relation Age of Onset  . Anuerysm Father   . Depression Mother   . Clotting disorder Mother   . Depression Sister        taking venlafaxine  . Cancer Son        lung  . Heart attack Paternal Grandfather   . Depression Sister   . Stroke Brother   . Heart  disease Neg Hx      Social History   Socioeconomic History  . Marital status: Married    Spouse name: Not on file  . Number of children: Not on file  . Years of education: Not on file  . Highest education level: Not on file  Occupational History  . Not on file  Social Needs  . Financial resource strain: Not on file  . Food insecurity    Worry: Not on file    Inability: Not on file  . Transportation needs    Medical: Not on file    Non-medical: Not on file  Tobacco Use  . Smoking status: Former Smoker    Types: Cigarettes    Quit date: 01/07/1991    Years since quitting: 27.8  . Smokeless tobacco: Former Systems developer    Types: Chew  . Tobacco comment: social smoker when he did smoke  Substance and Sexual Activity  . Alcohol use: No    Alcohol/week: 0.0 standard drinks  . Drug use: No  . Sexual activity: Not Currently  Lifestyle  . Physical activity    Days per week: Not on file    Minutes per session: Not on file  . Stress: Not on file  Relationships  . Social Herbalist on phone: Not on file    Gets together: Not on file    Attends religious service: Not on file    Active member of club or organization: Not on file    Attends meetings of clubs or organizations: Not on file    Relationship status: Not on file  . Intimate partner violence    Fear of current or ex partner: Not on file    Emotionally abused: Not on file    Physically abused: Not on file    Forced sexual activity: Not on file  Other Topics Concern  . Not on file  Social History Narrative  . Not on file     Review of Systems  Constitutional: Negative.   HENT: Negative.   Eyes: Negative.   Respiratory: Negative.   Cardiovascular: Negative.   Gastrointestinal: Negative.   Endocrine: Negative.   Genitourinary: Negative.   Musculoskeletal: Negative.   Skin: Negative.   Allergic/Immunologic: Negative.   Neurological: Negative.   Hematological: Negative.   Psychiatric/Behavioral:  Negative.   All other systems reviewed and are negative.      Objective:   Vitals:   11/19/18 1521  BP: 118/72  Pulse: 72  Resp: 14  Temp: (!) 97.5 F (36.4 C)  SpO2: 94%  Weight:  224 lb 12.8 oz (102 kg)    Body mass index is 29.66 kg/m.  Physical Exam Vitals signs and nursing note reviewed.  Constitutional:      Appearance: He is well-developed.  HENT:     Head: Normocephalic and atraumatic.     Nose: Nose normal.  Eyes:     General:        Right eye: No discharge.        Left eye: No discharge.     Conjunctiva/sclera: Conjunctivae normal.  Neck:     Trachea: No tracheal deviation.  Cardiovascular:     Rate and Rhythm: Normal rate and regular rhythm.  Pulmonary:     Effort: Pulmonary effort is normal. No respiratory distress.     Breath sounds: No stridor.  Musculoskeletal: Normal range of motion.     Left elbow: He exhibits effusion. He exhibits normal range of motion, no swelling, no deformity and no laceration. No tenderness found.     Comments: Large left elbow effusion very soft and a 6 x 6 cm area roughly, no erythema, induration or tenderness to palpation Good range of motion, sensation and strength No obvious deformity  Skin:    General: Skin is warm and dry.     Findings: No rash.  Neurological:     Mental Status: He is alert.     Motor: No abnormal muscle tone.     Coordination: Coordination normal.     Gait: Gait abnormal.  Psychiatric:        Behavior: Behavior normal.            Assessment & Plan:     Dx ICD-10-CM Orders  1. Effusion of bursa of left elbow    Patient wanted fluid taken off of his elbow, do not have the supplies in office currently to do the required compression dressing for immediately after the aspiration.  Attempted to refer the patient to Ortho or to Ortho urgent care to have it done sooner but he refuses and wants to come back here next week -will attempt to gather supplies really only need co-band in addition to  what we are to have in the clinic, he will return next week for procedure only appointment  I have explained that fluid may reacumulate and sometimes pts still have to go to Ortho for more invasive tx.  Pt still insists on coming back next week.  Does not appear infected or septic.   M25.422 DG Elbow Complete Left   2. B12 deficiency  E53.8 cyanocobalamin ((VITAMIN B-12)) injection 1,000 mcg   got nurse injection today        Delsa Grana, PA-C 11/19/18 3:46 PM

## 2018-11-19 NOTE — Patient Instructions (Signed)
Return for aspiration of the fluid  Go get your Xray done.  Elbow Bursitis Bursitis is swelling and pain at the tip of your elbow. This happens when fluid builds up in a sac under your skin (bursa). This may also be called olecranon bursitis. Follow these instructions at home: Medicines  Take over-the-counter and prescription medicines only as told by your doctor.  If you were prescribed an antibiotic, take it exactly as told by your doctor. Do not stop taking it even if you start to feel better. Managing pain, stiffness, and swelling   If told, put ice on your elbow: ? Put ice in a plastic bag. ? Place a towel between your skin and the bag. ? Leave the ice on for 20 minutes, 2-3 times a day.  If your bursitis is caused by an injury, follow instructions from your doctor about: ? Resting your elbow. ? Wearing a bandage.  Wear elbow pads or elbow wraps as needed. These help cushion your elbow. General instructions  Avoid any activities that cause elbow pain. Ask your doctor what activities are safe for you.  Keep all follow-up visits as told by your doctor. This is important. Contact a doctor if you have:  A fever.  Problems that do not get better with treatment.  Pain or swelling that: ? Gets worse. ? Goes away and then comes back.  Pus draining from your elbow. Get help right away if you have:  Trouble moving your arm, hand, or fingers. Summary  Bursitis is swelling and pain at the tip of the elbow.  You may need to take medicine or put ice on your elbow.  Contact your doctor if your problems do not get better with treatment. This information is not intended to replace advice given to you by your health care provider. Make sure you discuss any questions you have with your health care provider. Document Released: 08/14/2009 Document Revised: 02/06/2017 Document Reviewed: 02/03/2017 Elsevier Patient Education  2020 [employer].

## 2018-11-22 ENCOUNTER — Other Ambulatory Visit: Payer: Self-pay

## 2018-11-22 ENCOUNTER — Ambulatory Visit
Admission: RE | Admit: 2018-11-22 | Discharge: 2018-11-22 | Disposition: A | Discharge status: Home / Self Care | Payer: Medicare HMO | Source: Ambulatory Visit | Attending: Family Medicine | Admitting: Family Medicine

## 2018-11-22 ENCOUNTER — Ambulatory Visit
Admission: RE | Admit: 2018-11-22 | Discharge: 2018-11-22 | Disposition: A | Discharge status: Home / Self Care | Payer: Medicare HMO | Attending: Family Medicine | Admitting: Family Medicine

## 2018-11-22 DIAGNOSIS — M25422 Effusion, left elbow: Secondary | ICD-10-CM | POA: Insufficient documentation

## 2018-11-22 DIAGNOSIS — M7989 Other specified soft tissue disorders: Secondary | ICD-10-CM | POA: Diagnosis not present

## 2018-11-22 DIAGNOSIS — S59902A Unspecified injury of left elbow, initial encounter: Secondary | ICD-10-CM | POA: Diagnosis not present

## 2018-11-22 NOTE — Chronic Care Management (AMB) (Signed)
Chronic Care Management   Note  11/22/2018 Name: [name] MRN: [id number] DOB: 1940/04/16  Drew Morgan is a 78 y.o. year old male who is a primary care patient of Delsa Grana, [place]. I reached out to Drew Morgan by phone today in response to a referral sent by Mr. Drew Morgan's health plan.    [name] was given information about Chronic Care Management services today including:  1. CCM service includes personalized support from designated clinical staff supervised by his physician, including individualized plan of care and coordination with other care providers 2. 24/7 contact phone numbers for assistance for urgent and routine care needs. 3. Service will only be billed when office clinical staff spend 20 minutes or more in a month to coordinate care. 4. Only one practitioner may furnish and bill the service in a calendar month. 5. The patient may stop CCM services at any time (effective at the end of the month) by phone call to the office staff. 6. The patient will be responsible for cost sharing (co-pay) of up to 20% of the service fee (after annual deductible is met).  Patient agreed to services and verbal consent obtained.   Follow up plan: Telephone appointment with CCM team member scheduled for: 11/26/2018  [place]  [email or web address]   ??[phone]

## 2018-11-22 NOTE — Chronic Care Management (AMB) (Signed)
Made in error

## 2018-11-23 ENCOUNTER — Encounter: Payer: Self-pay | Admitting: Family Medicine

## 2018-11-23 ENCOUNTER — Ambulatory Visit (INDEPENDENT_AMBULATORY_CARE_PROVIDER_SITE_OTHER): Payer: Medicare HMO | Admitting: Family Medicine

## 2018-11-23 VITALS — BP 128/80 | HR 75 | Temp 97.1°F | Resp 14 | Ht 73.0 in | Wt 224.0 lb

## 2018-11-23 DIAGNOSIS — M25422 Effusion, left elbow: Secondary | ICD-10-CM | POA: Diagnosis not present

## 2018-11-23 MED ORDER — LIDOCAINE HCL (PF) 1 % IJ SOLN: 2.0000 mL | Freq: Once | INTRAMUSCULAR | Status: DC

## 2018-11-23 NOTE — Patient Instructions (Signed)
Keep the compression dressing in place for the next 24 hours and when replacing try to do quickly with ace wrap or coband.    If the fluid reaccumulates I will need to send you to orthopedics for specialist evaluation.  There is risk with repeated aspiration of fluid, and sometimes ortho must do more invasive procedures to treat this.   Please notify me immediately if you see any signs of infection including pain, redness, swelling, pain with movement, warmth, or drainage.

## 2018-11-23 NOTE — Progress Notes (Signed)
Joint Injection/Arthrocentesis  Date/Time: 11/23/2018 11:47 AM Performed by: Delsa Grana, PA-C Authorized by: Delsa Grana, PA-C  Indications: diagnostic evaluation and joint swelling (left elbow effusion)  Body area: elbow Local anesthesia used: yes Anesthesia: local infiltration  Anesthesia: Local anesthesia used: yes Local Anesthetic: lidocaine 1% without epinephrine Anesthetic total: 2 mL  Sedation: Patient sedated: no  Preparation: Patient was prepped and draped in the usual sterile fashion. Needle size: 22 G Ultrasound guidance: no Approach: lateral Aspirate: bloody Aspirate amount: 30 mL Patient tolerance: patient tolerated the procedure well with no immediate complications Comments: Bandaid and then compression dressing was immediately applied to left elbow after aspiration completed.  Instructed to leave on overnight before replacing tomorrow.  Signs and sx of infection were reviewed as well as aftercare and f/up care.  Pt verbalized understanding.  Has Ortho specialist at the [place].   1. Effusion of bursa of left elbow  Traumatic left elbow effusion - has not appeared infected, Xray previously done with OV and exam unremarkable.  Aspiration today (procedure only appt) is medically necessary for treatment and for diagnosis.  Pt on blood thinners with hx of coagulopathy, not surprising the fluid aspirated was bloody, no purulence or odor.  Reviewed signs of infection with pt and he was encouraged to f/up with [place] immediately if any concerns.  Also advised he will need ortho if fluid reaccumulates.  Instructions and hand out given. - Joint Injection/Arthrocentesis - Anaerobic and Aerobic Culture - Cytology - non gyn  F/up as needed.  Delsa Grana, PA-C 11/23/18 12:59 PM

## 2018-11-24 LAB — NON-GYN, SPECIMEN A

## 2018-11-24 LAB — CYTOLOGY - NON PAP

## 2018-11-26 ENCOUNTER — Ambulatory Visit: Payer: Self-pay | Admitting: Pharmacist

## 2018-11-26 ENCOUNTER — Telehealth: Payer: Medicare HMO

## 2018-11-26 ENCOUNTER — Other Ambulatory Visit: Payer: Self-pay

## 2018-11-26 MED ORDER — ACCU-CHEK FASTCLIX LANCETS MISC: 11 refills | Status: DC

## 2018-11-26 MED ORDER — ACCU-CHEK NANO SMARTVIEW W/DEVICE KIT: PACK | 0 refills | Status: AC

## 2018-11-26 MED ORDER — GLUCOSE BLOOD VI STRP: ORAL_STRIP | 11 refills | Status: AC

## 2018-11-26 NOTE — Chronic Care Management (AMB) (Signed)
  Chronic Care Management   Note  11/26/2018 Name: Drew Morgan MRN: [id number] DOB: Feb 13, 1941  78 y.o. year old male referred to Chronic Care Management by his health plan. Last office visit with Delsa Grana, PA-C was 11/23/18.   Was unable to reach patient via telephone today and have left HIPAA compliant voicemail asking patient to return my call. (unsuccessful outreach #1).  Follow up plan: A HIPPA compliant phone message was left for the patient providing contact information and requesting a return call.  The care management team will reach out to the patient again over the next 5-7 days.   Ruben Reason, PharmD Clinical Pharmacist [facility] [phone]

## 2018-11-29 ENCOUNTER — Telehealth: Payer: Self-pay | Admitting: Family Medicine

## 2018-11-29 LAB — ANAEROBIC AND AEROBIC CULTURE
AER RESULT:: NO GROWTH
MICRO NUMBER:: 884796
MICRO NUMBER:: 884797
SPECIMEN QUALITY:: ADEQUATE
SPECIMEN QUALITY:: ADEQUATE

## 2018-11-29 NOTE — Telephone Encounter (Signed)
PER pt says the pa

## 2018-11-29 NOTE — Telephone Encounter (Signed)
Patient called asking that we fax over his recent office visit to Dr. Geni Bers C. Raji at [phone].  Release ID # [id number].  A copy was placed upfront for patient to come by and pick up.

## 2018-11-30 ENCOUNTER — Telehealth: Payer: Self-pay | Admitting: Family Medicine

## 2018-11-30 LAB — ANAEROBIC AND AEROBIC CULTURE
AER RESULT:: NO GROWTH
GRAM STAIN:: NONE SEEN
MICRO NUMBER:: 888046
MICRO NUMBER:: 888047
SPECIMEN QUALITY:: ADEQUATE
SPECIMEN QUALITY:: ADEQUATE

## 2018-11-30 NOTE — Telephone Encounter (Signed)
[employer] requesting Alcohol Swabs (B-D SINGLE USE SWABS REGULAR) PADS and ACCU CHEK GUIDE LEVEL ONE SOLUTION please send a 90 day supply   [facility] - [place], [place] [phone] (Phone) [phone] (Fax)

## 2018-12-06 ENCOUNTER — Other Ambulatory Visit: Payer: Self-pay | Admitting: Family Medicine

## 2018-12-06 MED ORDER — BD SWAB SINGLE USE REGULAR PADS: 1.0000 [IU] | MEDICATED_PAD | Freq: Four times a day (QID) | 5 refills | Status: AC

## 2018-12-06 MED ORDER — ACCU-CHEK FASTCLIX LANCETS MISC: 11 refills | Status: AC

## 2018-12-07 ENCOUNTER — Telehealth: Payer: Self-pay

## 2019-01-21 ENCOUNTER — Other Ambulatory Visit: Payer: Self-pay

## 2019-01-21 ENCOUNTER — Ambulatory Visit (INDEPENDENT_AMBULATORY_CARE_PROVIDER_SITE_OTHER): Payer: Medicare HMO

## 2019-01-21 DIAGNOSIS — Z23 Encounter for immunization: Secondary | ICD-10-CM

## 2019-01-26 ENCOUNTER — Telehealth: Payer: Self-pay

## 2019-01-26 ENCOUNTER — Ambulatory Visit: Payer: Self-pay | Admitting: Pharmacist

## 2019-01-26 NOTE — Chronic Care Management (AMB) (Signed)
  Chronic Care Management   Note  01/26/2019 Name: Drew Morgan MRN: [id number] DOB: January 13, 1941  78 y.o. year old male referred to Chronic Care Management by his health plan. Last office visit with Delsa Grana, PA-C was 11/23/18.   Patient returned call to pharmacist and left voicemail that was unfortunately not recieved until 01/25/19.   Was unable to reach patient via telephone today and have left HIPAA compliant voicemail asking patient to return my call. (unsuccessful outreach #2).  Follow up plan: A HIPPA compliant phone message was left for the patient providing contact information and requesting a return call.  The care management team will reach out to the patient again over the next 5-7 days.   Ruben Reason, PharmD Clinical Pharmacist [facility] [phone]

## 2019-02-01 ENCOUNTER — Telehealth: Payer: Self-pay

## 2019-02-02 ENCOUNTER — Ambulatory Visit: Payer: Self-pay | Admitting: Pharmacist

## 2019-02-02 ENCOUNTER — Telehealth: Payer: Self-pay

## 2019-02-02 NOTE — Chronic Care Management (AMB) (Signed)
  Chronic Care Management   Note  02/02/2019 Name: Drew Morgan MRN: [id number] DOB: 21-Apr-1940  78 y.o. year old male referred to Chronic Care Management by his health plan. Last office visit with Delsa Grana, PA-C was 11/23/18.   Patient returned call to pharmacist and left voicemail that was unfortunately not recieved until 01/25/19.   Was unable to reach patient via telephone today and have left HIPAA compliant voicemail asking patient to return my call. (unsuccessful outreach #3).  Follow up plan: A HIPPA compliant phone message was left for the patient providing contact information and requesting a return call.   Outreach calls will be discontinued per protocol after 3 unsuccessful attempts.   Ruben Reason, PharmD Clinical Pharmacist [facility] [phone]

## 2019-03-14 ENCOUNTER — Ambulatory Visit: Payer: Medicare HMO | Admitting: Family Medicine

## 2019-03-21 ENCOUNTER — Ambulatory Visit: Payer: Medicare HMO | Attending: Internal Medicine

## 2019-03-21 DIAGNOSIS — Z20822 Contact with and (suspected) exposure to covid-19: Secondary | ICD-10-CM | POA: Diagnosis not present

## 2019-03-22 ENCOUNTER — Telehealth: Payer: Self-pay

## 2019-03-22 LAB — NOVEL CORONAVIRUS, NAA: SARS-CoV-2, NAA: DETECTED — AB

## 2019-03-22 NOTE — Telephone Encounter (Signed)
Positive covid test results given to patient. He voiced understanding.

## 2019-03-23 ENCOUNTER — Telehealth: Payer: Self-pay | Admitting: Nurse Practitioner

## 2019-03-23 NOTE — Telephone Encounter (Signed)
Called to Discuss with patient about Covid symptoms and the use of bamlanivimab, a monoclonal antibody infusion for those with mild to moderate Covid symptoms and at a high risk of hospitalization.     Pt is qualified for this infusion at the [facility] center due to co-morbid conditions and/or a member of an at-risk group.     Unable to reach pt  

## 2019-03-24 ENCOUNTER — Emergency Department: Payer: No Typology Code available for payment source

## 2019-03-24 ENCOUNTER — Other Ambulatory Visit: Payer: Self-pay

## 2019-03-24 ENCOUNTER — Inpatient Hospital Stay
Admission: EM | Admit: 2019-03-24 | Discharge: 2019-03-27 | DRG: 177 | Disposition: A | Discharge status: Home / Self Care | Payer: No Typology Code available for payment source | Attending: Internal Medicine | Admitting: Internal Medicine

## 2019-03-24 DIAGNOSIS — Z955 Presence of coronary angioplasty implant and graft: Secondary | ICD-10-CM

## 2019-03-24 DIAGNOSIS — R0602 Shortness of breath: Secondary | ICD-10-CM | POA: Diagnosis not present

## 2019-03-24 DIAGNOSIS — I2510 Atherosclerotic heart disease of native coronary artery without angina pectoris: Secondary | ICD-10-CM | POA: Diagnosis present

## 2019-03-24 DIAGNOSIS — Z794 Long term (current) use of insulin: Secondary | ICD-10-CM

## 2019-03-24 DIAGNOSIS — Z87891 Personal history of nicotine dependence: Secondary | ICD-10-CM

## 2019-03-24 DIAGNOSIS — Z791 Long term (current) use of non-steroidal anti-inflammatories (NSAID): Secondary | ICD-10-CM

## 2019-03-24 DIAGNOSIS — Z7901 Long term (current) use of anticoagulants: Secondary | ICD-10-CM

## 2019-03-24 DIAGNOSIS — U071 COVID-19: Principal | ICD-10-CM | POA: Diagnosis present

## 2019-03-24 DIAGNOSIS — Z79899 Other long term (current) drug therapy: Secondary | ICD-10-CM

## 2019-03-24 DIAGNOSIS — N179 Acute kidney failure, unspecified: Secondary | ICD-10-CM | POA: Diagnosis present

## 2019-03-24 DIAGNOSIS — I10 Essential (primary) hypertension: Secondary | ICD-10-CM | POA: Diagnosis present

## 2019-03-24 DIAGNOSIS — D6851 Activated protein C resistance: Secondary | ICD-10-CM | POA: Diagnosis present

## 2019-03-24 DIAGNOSIS — R5381 Other malaise: Secondary | ICD-10-CM | POA: Diagnosis not present

## 2019-03-24 DIAGNOSIS — Z86718 Personal history of other venous thrombosis and embolism: Secondary | ICD-10-CM

## 2019-03-24 DIAGNOSIS — E119 Type 2 diabetes mellitus without complications: Secondary | ICD-10-CM | POA: Diagnosis present

## 2019-03-24 DIAGNOSIS — Z23 Encounter for immunization: Secondary | ICD-10-CM

## 2019-03-24 DIAGNOSIS — F329 Major depressive disorder, single episode, unspecified: Secondary | ICD-10-CM | POA: Diagnosis present

## 2019-03-24 DIAGNOSIS — Z7952 Long term (current) use of systemic steroids: Secondary | ICD-10-CM

## 2019-03-24 DIAGNOSIS — J9601 Acute respiratory failure with hypoxia: Secondary | ICD-10-CM | POA: Diagnosis present

## 2019-03-24 DIAGNOSIS — J449 Chronic obstructive pulmonary disease, unspecified: Secondary | ICD-10-CM | POA: Diagnosis present

## 2019-03-24 DIAGNOSIS — E785 Hyperlipidemia, unspecified: Secondary | ICD-10-CM | POA: Diagnosis present

## 2019-03-24 DIAGNOSIS — G4733 Obstructive sleep apnea (adult) (pediatric): Secondary | ICD-10-CM | POA: Diagnosis present

## 2019-03-24 LAB — COMPREHENSIVE METABOLIC PANEL
ALT: 98 U/L — ABNORMAL HIGH (ref 0–44)
AST: 151 U/L — ABNORMAL HIGH (ref 15–41)
Albumin: 4.5 g/dL (ref 3.5–5.0)
Alkaline Phosphatase: 64 U/L (ref 38–126)
Anion gap: 11 (ref 5–15)
BUN: 29 mg/dL — ABNORMAL HIGH (ref 8–23)
CO2: 24 mmol/L (ref 22–32)
Calcium: 9.2 mg/dL (ref 8.9–10.3)
Chloride: 96 mmol/L — ABNORMAL LOW (ref 98–111)
Creatinine, Ser: 1.66 mg/dL — ABNORMAL HIGH (ref 0.61–1.24)
GFR calc Af Amer: 46 mL/min — ABNORMAL LOW (ref >60)
GFR calc non Af Amer: 39 mL/min — ABNORMAL LOW (ref >60)
Glucose, Bld: 183 mg/dL — ABNORMAL HIGH (ref 70–99)
Potassium: 5.3 mmol/L — ABNORMAL HIGH (ref 3.5–5.1)
Sodium: 131 mmol/L — ABNORMAL LOW (ref 135–145)
Total Bilirubin: 2.4 mg/dL — ABNORMAL HIGH (ref 0.3–1.2)
Total Protein: 8.2 g/dL — ABNORMAL HIGH (ref 6.5–8.1)

## 2019-03-24 LAB — CBC
HCT: 45.7 % (ref 39.0–52.0)
Hemoglobin: 15.8 g/dL (ref 13.0–17.0)
MCH: 31.1 pg (ref 26.0–34.0)
MCHC: 34.6 g/dL (ref 30.0–36.0)
MCV: 90 fL (ref 80.0–100.0)
Platelets: 193 10*3/uL (ref 150–400)
RBC: 5.08 MIL/uL (ref 4.22–5.81)
RDW: 13 % (ref 11.5–15.5)
WBC: 11.1 10*3/uL — ABNORMAL HIGH (ref 4.0–10.5)
nRBC: 0 % (ref 0.0–0.2)

## 2019-03-24 LAB — FIBRIN DERIVATIVES D-DIMER (ARMC ONLY): Fibrin derivatives D-dimer (ARMC): 834.22 ng/mL (FEU) — ABNORMAL HIGH (ref 0.00–499.00)

## 2019-03-24 LAB — LACTATE DEHYDROGENASE: LDH: 203 U/L — ABNORMAL HIGH (ref 98–192)

## 2019-03-24 LAB — TROPONIN I (HIGH SENSITIVITY): Troponin I (High Sensitivity): 10 ng/L (ref <18)

## 2019-03-24 LAB — FIBRINOGEN: Fibrinogen: 551 mg/dL — ABNORMAL HIGH (ref 210–475)

## 2019-03-24 MED ORDER — DEXAMETHASONE SODIUM PHOSPHATE 10 MG/ML IJ SOLN
10.0000 mg | Freq: Once | INTRAMUSCULAR | Status: AC
  Administered 2019-03-24: 10 mg via INTRAVENOUS
  Filled 2019-03-24: qty 1

## 2019-03-24 MED ORDER — SODIUM CHLORIDE 0.9 % IV SOLN
200.0000 mg | Freq: Once | INTRAVENOUS | Status: AC
  Administered 2019-03-24: 200 mg via INTRAVENOUS
  Filled 2019-03-24: qty 200

## 2019-03-24 MED ORDER — SODIUM CHLORIDE 0.9 % IV SOLN
100.0000 mg | Freq: Every day | INTRAVENOUS | Status: DC
  Administered 2019-03-25 – 2019-03-27 (×3): 100 mg via INTRAVENOUS
  Filled 2019-03-24 (×4): qty 20

## 2019-03-24 NOTE — ED Triage Notes (Signed)
Pt states tested positive for covid Monday and is now here for shob and generalized weakness. Pt with no obvious distress at this time, co dry mouth.

## 2019-03-24 NOTE — ED Notes (Signed)
First nurse note: per ems from home with c/o weakness, pos Covid. 95%RA, HR 70. Alert and oriented.

## 2019-03-25 ENCOUNTER — Encounter: Payer: Self-pay | Admitting: Family Medicine

## 2019-03-25 DIAGNOSIS — N179 Acute kidney failure, unspecified: Secondary | ICD-10-CM

## 2019-03-25 DIAGNOSIS — I10 Essential (primary) hypertension: Secondary | ICD-10-CM | POA: Diagnosis not present

## 2019-03-25 DIAGNOSIS — E119 Type 2 diabetes mellitus without complications: Secondary | ICD-10-CM

## 2019-03-25 DIAGNOSIS — F329 Major depressive disorder, single episode, unspecified: Secondary | ICD-10-CM | POA: Diagnosis not present

## 2019-03-25 DIAGNOSIS — Z87891 Personal history of nicotine dependence: Secondary | ICD-10-CM | POA: Diagnosis not present

## 2019-03-25 DIAGNOSIS — E785 Hyperlipidemia, unspecified: Secondary | ICD-10-CM | POA: Diagnosis not present

## 2019-03-25 DIAGNOSIS — G4733 Obstructive sleep apnea (adult) (pediatric): Secondary | ICD-10-CM | POA: Diagnosis not present

## 2019-03-25 DIAGNOSIS — R7989 Other specified abnormal findings of blood chemistry: Secondary | ICD-10-CM

## 2019-03-25 DIAGNOSIS — Z791 Long term (current) use of non-steroidal anti-inflammatories (NSAID): Secondary | ICD-10-CM | POA: Diagnosis not present

## 2019-03-25 DIAGNOSIS — J449 Chronic obstructive pulmonary disease, unspecified: Secondary | ICD-10-CM | POA: Diagnosis not present

## 2019-03-25 DIAGNOSIS — J9601 Acute respiratory failure with hypoxia: Secondary | ICD-10-CM | POA: Diagnosis not present

## 2019-03-25 DIAGNOSIS — I2510 Atherosclerotic heart disease of native coronary artery without angina pectoris: Secondary | ICD-10-CM | POA: Diagnosis not present

## 2019-03-25 DIAGNOSIS — Z79899 Other long term (current) drug therapy: Secondary | ICD-10-CM | POA: Diagnosis not present

## 2019-03-25 DIAGNOSIS — Z794 Long term (current) use of insulin: Secondary | ICD-10-CM | POA: Diagnosis not present

## 2019-03-25 DIAGNOSIS — U071 COVID-19: Principal | ICD-10-CM

## 2019-03-25 DIAGNOSIS — E1159 Type 2 diabetes mellitus with other circulatory complications: Secondary | ICD-10-CM | POA: Diagnosis not present

## 2019-03-25 DIAGNOSIS — D6851 Activated protein C resistance: Secondary | ICD-10-CM | POA: Diagnosis not present

## 2019-03-25 DIAGNOSIS — Z955 Presence of coronary angioplasty implant and graft: Secondary | ICD-10-CM | POA: Diagnosis not present

## 2019-03-25 DIAGNOSIS — Z86718 Personal history of other venous thrombosis and embolism: Secondary | ICD-10-CM | POA: Diagnosis not present

## 2019-03-25 DIAGNOSIS — Z23 Encounter for immunization: Secondary | ICD-10-CM | POA: Diagnosis not present

## 2019-03-25 DIAGNOSIS — Z7901 Long term (current) use of anticoagulants: Secondary | ICD-10-CM | POA: Diagnosis not present

## 2019-03-25 DIAGNOSIS — Z7952 Long term (current) use of systemic steroids: Secondary | ICD-10-CM | POA: Diagnosis not present

## 2019-03-25 LAB — GLUCOSE, CAPILLARY
Glucose-Capillary: 264 mg/dL — ABNORMAL HIGH (ref 70–99)
Glucose-Capillary: 278 mg/dL — ABNORMAL HIGH (ref 70–99)
Glucose-Capillary: 294 mg/dL — ABNORMAL HIGH (ref 70–99)
Glucose-Capillary: 267 mg/dL — ABNORMAL HIGH (ref 70–99)

## 2019-03-25 LAB — HEMOGLOBIN A1C
Hgb A1c MFr Bld: 7.9 % — ABNORMAL HIGH (ref 4.8–5.6)
Mean Plasma Glucose: 180.03 mg/dL

## 2019-03-25 LAB — PROCALCITONIN: Procalcitonin: 0.11 ng/mL

## 2019-03-25 LAB — ABO/RH: ABO/RH(D): O POS

## 2019-03-25 LAB — C-REACTIVE PROTEIN: CRP: 3.6 mg/dL — ABNORMAL HIGH (ref <1.0)

## 2019-03-25 MED ORDER — SODIUM CHLORIDE 0.9 % IV SOLN: 200.0000 mg | Freq: Once | INTRAVENOUS | Status: DC

## 2019-03-25 MED ORDER — SODIUM CHLORIDE 0.9 % IV SOLN: INTRAVENOUS | Status: DC

## 2019-03-25 MED ORDER — ONDANSETRON HCL 4 MG/2ML IJ SOLN: 4.0000 mg | Freq: Four times a day (QID) | INTRAMUSCULAR | Status: DC | PRN

## 2019-03-25 MED ORDER — RIVAROXABAN 20 MG PO TABS
20.0000 mg | ORAL_TABLET | Freq: Every day | ORAL | Status: DC
  Administered 2019-03-25 – 2019-03-26 (×2): 20 mg via ORAL
  Filled 2019-03-25 (×3): qty 1

## 2019-03-25 MED ORDER — TRAZODONE HCL 50 MG PO TABS
25.0000 mg | ORAL_TABLET | Freq: Every evening | ORAL | Status: DC | PRN
  Administered 2019-03-25 – 2019-03-26 (×2): 25 mg via ORAL
  Filled 2019-03-25 (×2): qty 1

## 2019-03-25 MED ORDER — HYDROCOD POLST-CPM POLST ER 10-8 MG/5ML PO SUER: 5.0000 mL | Freq: Two times a day (BID) | ORAL | Status: DC | PRN

## 2019-03-25 MED ORDER — GUAIFENESIN-DM 100-10 MG/5ML PO SYRP: 10.0000 mL | ORAL_SOLUTION | ORAL | Status: DC | PRN

## 2019-03-25 MED ORDER — LISINOPRIL 20 MG PO TABS: 40.0000 mg | ORAL_TABLET | Freq: Every day | ORAL | Status: DC

## 2019-03-25 MED ORDER — GUAIFENESIN ER 600 MG PO TB12
600.0000 mg | ORAL_TABLET | Freq: Two times a day (BID) | ORAL | Status: DC
  Administered 2019-03-25 – 2019-03-27 (×5): 600 mg via ORAL
  Filled 2019-03-25 (×7): qty 1

## 2019-03-25 MED ORDER — ACETAMINOPHEN 325 MG PO TABS: 650.0000 mg | ORAL_TABLET | Freq: Four times a day (QID) | ORAL | Status: DC | PRN

## 2019-03-25 MED ORDER — DEXAMETHASONE SODIUM PHOSPHATE 10 MG/ML IJ SOLN
6.0000 mg | INTRAMUSCULAR | Status: DC
  Administered 2019-03-25 – 2019-03-27 (×3): 6 mg via INTRAVENOUS
  Filled 2019-03-25 (×2): qty 1

## 2019-03-25 MED ORDER — INSULIN ASPART PROT & ASPART (70-30 MIX) 100 UNIT/ML ~~LOC~~ SUSP
45.0000 [IU] | Freq: Two times a day (BID) | SUBCUTANEOUS | Status: DC
  Administered 2019-03-25 – 2019-03-26 (×3): 45 [IU] via SUBCUTANEOUS
  Filled 2019-03-25 (×3): qty 10

## 2019-03-25 MED ORDER — ASPIRIN EC 81 MG PO TBEC
81.0000 mg | DELAYED_RELEASE_TABLET | Freq: Every day | ORAL | Status: DC
  Administered 2019-03-25 – 2019-03-27 (×3): 81 mg via ORAL
  Filled 2019-03-25 (×3): qty 1

## 2019-03-25 MED ORDER — PNEUMOCOCCAL VAC POLYVALENT 25 MCG/0.5ML IJ INJ
0.5000 mL | INJECTION | INTRAMUSCULAR | Status: AC
  Administered 2019-03-26: 0.5 mL via INTRAMUSCULAR
  Filled 2019-03-25: qty 0.5

## 2019-03-25 MED ORDER — ONDANSETRON HCL 4 MG PO TABS: 4.0000 mg | ORAL_TABLET | Freq: Four times a day (QID) | ORAL | Status: DC | PRN

## 2019-03-25 MED ORDER — NITROGLYCERIN 0.4 MG SL SUBL: 0.4000 mg | SUBLINGUAL_TABLET | SUBLINGUAL | Status: DC | PRN

## 2019-03-25 MED ORDER — SODIUM CHLORIDE 0.9 % IV SOLN: 100.0000 mg | Freq: Every day | INTRAVENOUS | Status: DC

## 2019-03-25 MED ORDER — ATENOLOL 50 MG PO TABS
50.0000 mg | ORAL_TABLET | Freq: Two times a day (BID) | ORAL | Status: DC
  Administered 2019-03-25 – 2019-03-27 (×5): 50 mg via ORAL
  Filled 2019-03-25 (×7): qty 1

## 2019-03-25 MED ORDER — INSULIN ASPART 100 UNIT/ML ~~LOC~~ SOLN
0.0000 [IU] | Freq: Three times a day (TID) | SUBCUTANEOUS | Status: DC
  Administered 2019-03-25: 8 [IU] via SUBCUTANEOUS
  Administered 2019-03-26: 17:00:00 3 [IU] via SUBCUTANEOUS
  Administered 2019-03-26 (×2): 8 [IU] via SUBCUTANEOUS
  Administered 2019-03-26: 11 [IU] via SUBCUTANEOUS
  Administered 2019-03-27: 5 [IU] via SUBCUTANEOUS
  Administered 2019-03-27: 15 [IU] via SUBCUTANEOUS
  Filled 2019-03-25 (×7): qty 1

## 2019-03-25 MED ORDER — INSULIN ASPART 100 UNIT/ML ~~LOC~~ SOLN
0.0000 [IU] | Freq: Three times a day (TID) | SUBCUTANEOUS | Status: DC
  Administered 2019-03-25 (×2): 8 [IU] via SUBCUTANEOUS
  Filled 2019-03-25 (×3): qty 1

## 2019-03-25 MED ORDER — VITAMIN D 25 MCG (1000 UNIT) PO TABS
1000.0000 [IU] | ORAL_TABLET | Freq: Every day | ORAL | Status: DC
  Administered 2019-03-25 – 2019-03-27 (×3): 1000 [IU] via ORAL
  Filled 2019-03-25 (×3): qty 1

## 2019-03-25 MED ORDER — ENOXAPARIN SODIUM 40 MG/0.4ML ~~LOC~~ SOLN: 40.0000 mg | SUBCUTANEOUS | Status: DC

## 2019-03-25 MED ORDER — AMLODIPINE BESYLATE 5 MG PO TABS
5.0000 mg | ORAL_TABLET | Freq: Every day | ORAL | Status: DC
  Administered 2019-03-25 – 2019-03-27 (×3): 5 mg via ORAL
  Filled 2019-03-25 (×3): qty 1

## 2019-03-25 MED ORDER — MUPIROCIN 2 % EX OINT
TOPICAL_OINTMENT | Freq: Two times a day (BID) | CUTANEOUS | Status: DC
  Filled 2019-03-25: qty 22

## 2019-03-25 MED ORDER — MAGNESIUM HYDROXIDE 400 MG/5ML PO SUSP: 30.0000 mL | Freq: Every day | ORAL | Status: DC | PRN

## 2019-03-25 MED ORDER — VENLAFAXINE HCL ER 75 MG PO CP24
150.0000 mg | ORAL_CAPSULE | Freq: Every day | ORAL | Status: DC
  Administered 2019-03-25 – 2019-03-27 (×3): 150 mg via ORAL
  Filled 2019-03-25 (×3): qty 2

## 2019-03-25 NOTE — H&P (Signed)
[facility] NAME: Drew Morgan    MR#:  [id number]  DATE OF BIRTH:  05-24-1940  DATE OF ADMISSION:  03/24/2019  PRIMARY CARE PHYSICIAN: Delsa Grana, PA-C   REQUESTING/REFERRING PHYSICIAN: Marjean Donna, MD  CHIEF COMPLAINT:   Chief Complaint  Patient presents with  . Weakness  . Shortness of Breath    HISTORY OF PRESENT ILLNESS:  Drew Morgan  is a 79 y.o. Caucasian male with a known history of multiple medical problems including coronary artery disease, type diabetes mellitus, hypertension, dyslipidemia and obstructive sleep apnea, who presented to the emergency room with acute onset of worsening dyspnea with associated cough and generalized weakness as well as vomiting.  He was was tested for COVID-19 on Monday and had the test result positive on Tuesday.  He denied any fever or chills however admits to loss of taste.  No diarrhea or bleeding diathesis.  No chest pain or palpitations.  No dysuria, oliguria or hematuria or flank pain.  Upon presentation to the emergency room, respiratory to 22 and pulse symmetry was 88% on room air and it went up to 96% on 2 L O2 by nasal cannula.  Labs revealed potassium 5.3 and glucose of 183 with a BUN of 29 creatinine 1.66 with normal previous levels AST 131 ALT 98 with troponin of 8.2 and total bilirubin of 2.4.  High-sensitivity troponin I was 10.  Procalcitonin was 0.1 CBC showed total mild Oxydose of 11.1.  Chest x-ray showed no acute cardiopulmonary disease.  UA showed Normal sinus rhythm with rate of 72 with nonspecific T wave changes.  The patient was given 10 mg of IV Decadron as well as IV remdesivir.  He will be admitted to a medical monitored bed for further evaluation and management. PAST MEDICAL HISTORY:   Past Medical History:  Diagnosis Date  . ADHD (attention deficit hyperactivity disorder) 07/30/2015  . Anticoagulation goal of INR 2 to 3 07/30/2015  . Arthritis    shoulder, both knees,  hip, back  . Chronic obstructive pulmonary disease ([place]) 09/22/2017  . Coronary artery disease   . Depression   . Diabetes mellitus without complication ([place])   . Factor V Leiden ([place])   . History of shingles   . Hyperlipidemia   . Hypertension   . Insomnia   . Intermittent explosive disorder 07/30/2015  . OSA (obstructive sleep apnea)   . Testicular dysfunction     PAST SURGICAL HISTORY:   Past Surgical History:  Procedure Laterality Date  . BACK SURGERY    . CORONARY ANGIOPLASTY WITH STENT PLACEMENT  1995  . HEMORRHOID SURGERY    . UVULOPLASTY      SOCIAL HISTORY:   Social History   Tobacco Use  . Smoking status: Former Smoker    Types: Cigarettes    Quit date: 01/07/1991    Years since quitting: 28.2  . Smokeless tobacco: Former Systems developer    Types: Chew  . Tobacco comment: social smoker when he did smoke  Substance Use Topics  . Alcohol use: No    Alcohol/week: 0.0 standard drinks    FAMILY HISTORY:   Family History  Problem Relation Age of Onset  . Anuerysm Father   . Depression Mother   . Clotting disorder Mother   . Depression Sister        taking venlafaxine  . Cancer Son        lung  . Heart attack Paternal Grandfather   . Depression  Sister   . Stroke Brother   . Heart disease Neg Hx     DRUG ALLERGIES:   Allergies  Allergen Reactions  . Statins Other (See Comments)    REVIEW OF SYSTEMS:   ROS As per history of present illness. All pertinent systems were reviewed above. Constitutional,  HEENT, cardiovascular, respiratory, GI, GU, musculoskeletal, neuro, psychiatric, endocrine,  integumentary and hematologic systems were reviewed and are otherwise  negative/unremarkable except for positive findings mentioned above in the HPI.   MEDICATIONS AT HOME:   Prior to Admission medications   Medication Sig Start Date End Date Taking? Authorizing Provider  Accu-Chek FastClix Lancets MISC TEST BLOOD SUGAR THREE TIMES DAILY for dx  ICD-10-E11.65  12/06/18  Yes Delsa Grana, PA-C  Alcohol Swabs (B-D SINGLE USE SWABS REGULAR) PADS Apply 1 Units topically 4 (four) times daily. 12/06/18  Yes Delsa Grana, PA-C  amLODipine (NORVASC) 5 MG tablet Take 1 tablet (5 mg total) by mouth daily. 09/23/18  Yes Poulose, Bethel Born, NP  atenolol (TENORMIN) 50 MG tablet Take 1 tablet (50 mg total) by mouth 2 (two) times daily. 09/23/18  Yes Poulose, Bethel Born, NP  Blood Glucose Monitoring Suppl (ACCU-CHEK NANO SMARTVIEW) w/Device KIT Disp one device per insurance for Dx ICD 10 E11.65 11/26/18  Yes Delsa Grana, PA-C  glucose blood test strip Test three times a day; Dx E11.65 11/26/18  Yes Delsa Grana, PA-C  insulin NPH-regular Human (NOVOLIN 70/30) (70-30) 100 UNIT/ML injection Inject 90 Units into the skin 2 (two) times daily with a meal.    Yes [provider]  Insulin Pen Needle (B-D ULTRAFINE III SHORT PEN) 31G X 8 MM MISC For use with insulin pen, use once a day; LON 99 months; Dx E11.65 10/17/15  Yes Lada, Satira Anis, MD  lisinopril (ZESTRIL) 40 MG tablet Take 1 tablet (40 mg total) by mouth daily. 09/23/18  Yes Poulose, Bethel Born, NP  mupirocin ointment (BACTROBAN) 2 % APPLY TO AFFECTED AREA TWICE A DAY 01/02/17  Yes [provider]  nitroGLYCERIN (NITROSTAT) 0.4 MG SL tablet Place 1 tablet (0.4 mg total) under the tongue every 5 (five) minutes as needed for chest pain. Up to 3 doses; call 911 09/23/18  Yes Poulose, Bethel Born, NP  rivaroxaban (XARELTO) 20 MG TABS tablet Take 20 mg by mouth daily.    Yes [provider]  venlafaxine XR (EFFEXOR-XR) 150 MG 24 hr capsule Take 150 mg by mouth daily with breakfast.   Yes [provider]  diclofenac sodium (VOLTAREN) 1 % GEL Apply 2 grams to hands or 4 grams to knees up to 4 times a day; use just where needed Patient not taking: Reported on 11/19/2018 11/24/17   Arnetha Courser, MD      VITAL SIGNS:  Blood pressure (!) 133/53, pulse 73, temperature 98.3 F (36.8 C),  temperature source Oral, resp. rate 16, height '6\' 1"'  (1.854 m), weight 100.7 kg, SpO2 96 %.  PHYSICAL EXAMINATION:  Physical Exam  GENERAL:  79 y.o.-year-old Caucasian male patient lying in the bed with minimal respiratory distress with conversational dyspnea. EYES: Pupils equal, round, reactive to light and accommodation. No scleral icterus. Extraocular muscles intact.  HEENT: Head atraumatic, normocephalic. Oropharynx and nasopharynx clear.  NECK:  Supple, no jugular venous distention. No thyroid enlargement, no tenderness.  LUNGS: Slightly diminished bibasal and left midlung zone breath sounds. CARDIOVASCULAR: Regular rate and rhythm, S1, S2 normal. No murmurs, rubs, or gallops.  ABDOMEN: Soft, nondistended, nontender. Bowel sounds present.  No organomegaly or mass.  EXTREMITIES: No pedal edema, cyanosis, or clubbing.  NEUROLOGIC: Cranial nerves II through XII are intact. Muscle strength 5/5 in all extremities. Sensation intact. Gait not checked.  PSYCHIATRIC: The patient is alert and oriented x 3.  Normal affect and good eye contact. SKIN: No obvious rash, lesion, or ulcer.   LABORATORY PANEL:   CBC Recent Labs  Lab 03/24/19 1915  WBC 11.1*  HGB 15.8  HCT 45.7  PLT 193   ------------------------------------------------------------------------------------------------------------------  Chemistries  Recent Labs  Lab 03/24/19 1915  NA 131*  K 5.3*  CL 96*  CO2 24  GLUCOSE 183*  BUN 29*  CREATININE 1.66*  CALCIUM 9.2  AST 151*  ALT 98*  ALKPHOS 64  BILITOT 2.4*   ------------------------------------------------------------------------------------------------------------------  Cardiac Enzymes No results for input(s): TROPONINI in the last 168 hours. ------------------------------------------------------------------------------------------------------------------  RADIOLOGY:  DG Chest 2 View  Result Date: 03/24/2019 CLINICAL DATA:  79 year old male with shortness  of breath. EXAM: CHEST - 2 VIEW COMPARISON:  Chest radiograph dated 07/30/2017. FINDINGS: Minimal faint densities bilaterally likely artifactual and corresponding to the superimposition of the costochondral junction. There is no focal consolidation, pleural effusion, pneumothorax. Stable cardiac silhouette. No acute osseous pathology. IMPRESSION: No focal consolidation. Electronically Signed   By: Anner Crete M.D.   On: 03/24/2019 19:48      IMPRESSION AND PLAN:   1.  COVID-19 with associated hypoxemic acute respiratory failure. -The patient will be admitted to the medical monitored isolation bed. -We will follow inflammatory markers. -We will continue IV remdesivir and Decadron for now. -Given elevated D-dimer will obtain a chest CTA with improvement in his creatinine with hydration. -We will place him on vitamin D3, vitamin C, zinc sulfate, p.o. Pepcid and aspirin. -O2 protocol will be followed.  2.  Acute kidney injury. -This likely secondary to volume depletion and dehydration. -The patient will be placed on hydration with IV normal saline and will follow his BMP.  3.  Elevated LFTs. -The patient will be hydrated with IV normal saline and will follow his LFTs.  4.  Type 2 diabetes mellitus. -The patient will be placed on supplement coverage with NovoLog.  5.  Hypertension. -We will continue his amlodipine and atenolol.  6.  Coronary artery disease status post PCI and 2 stents. -We will continue beta-blocker therapy as well as aspirin.  7.  History of DVT. -We will continue Xarelto.  8.  Depression. -We will continue his Effexor XR and trazodone.  9.  DVT prophylaxis. -Subcutaneous Lovenox.    All the records are reviewed and case discussed with ED provider. The plan of care was discussed in details with the patient (and family). I answered all questions. The patient agreed to proceed with the above mentioned plan. Further management will depend upon hospital  course.   CODE STATUS: Full code  TOTAL TIME TAKING CARE OF THIS PATIENT: 85 minutes.    Christel Mormon M.D on 03/25/2019 at 5:34 AM  [facility]   From 7 PM-7 AM, contact night-coverage [email or web address]  CC: Primary care physician; Delsa Grana, PA-C   Note: This dictation was prepared with Dragon dictation along with smaller phrase technology. Any transcriptional errors that result from this process are unintentional.

## 2019-03-25 NOTE — Progress Notes (Signed)
Ch spoke with Pt over the phone. Ch introduced self, and asked patient how he was doing, and if he would need prayer or anything from Ch; Pt said " No, I'm fine, I appreciate you calling me". Ch thanked Pt, and ended call.   03/25/19 0918  Clinical Encounter Type  Visited With Patient  Visit Type Initial  Referral From Other (Comment)  Consult/Referral To None  Spiritual Encounters  Spiritual Needs Other (Comment)  Stress Factors  Patient Stress Factors Health changes

## 2019-03-25 NOTE — ED Notes (Signed)
Report given to incoming RN. States pt is going to 120

## 2019-03-25 NOTE — ED Provider Notes (Signed)
[facility] Emergency Department Provider Note  ____________________________________________   First MD Initiated Contact with Patient 03/24/19 2247     (approximate)  I have reviewed the triage vital signs and the nursing notes.   HISTORY  Chief Complaint Weakness and Shortness of Breath   HPI Drew Morgan is a 79 y.o. male with below list of previous medical conditions eluding recently diagnosed COVID-19 on Monday presents to the emergency department secondary to progressive dyspnea cough generalized weakness and vomiting.  Patient also admits to loss of taste.  Patient denies any diarrhea.  Patient lowest oxygen saturation since arrival 88%        Past Medical History:  Diagnosis Date  . ADHD (attention deficit hyperactivity disorder) 07/30/2015  . Anticoagulation goal of INR 2 to 3 07/30/2015  . Arthritis    shoulder, both knees, hip, back  . Chronic obstructive pulmonary disease ([place]) 09/22/2017  . Coronary artery disease   . Depression   . Diabetes mellitus without complication ([place])   . Factor V Leiden ([place])   . History of shingles   . Hyperlipidemia   . Hypertension   . Insomnia   . Intermittent explosive disorder 07/30/2015  . OSA (obstructive sleep apnea)   . Testicular dysfunction     Patient Active Problem List   Diagnosis Date Noted  . Senile purpura ([place]) 09/08/2018  . Chronic obstructive pulmonary disease ([place]) 09/22/2017  . B12 deficiency 01/02/2017  . Memory difficulties 11/03/2016  . Stress due to family tension 07/02/2016  . Elevated serum glutamic pyruvic transaminase (SGPT) level 04/01/2016  . Coronary artery disease   . OSA (obstructive sleep apnea)   . Intermittent explosive disorder 07/30/2015  . ADHD (attention deficit hyperactivity disorder) 07/30/2015  . Arthritis of both knees 07/30/2015  . Type 2 diabetes, uncontrolled, with neuropathy ([place]) 10/11/2014  . Essential hypertension 10/11/2014  .  Hyperlipidemia 10/11/2014  . Factor V Leiden mutation ([place]) 10/11/2014    Past Surgical History:  Procedure Laterality Date  . BACK SURGERY    . CORONARY ANGIOPLASTY WITH STENT PLACEMENT  1995  . HEMORRHOID SURGERY    . UVULOPLASTY      Prior to Admission medications   Medication Sig Start Date End Date Taking? Authorizing Provider  Accu-Chek FastClix Lancets MISC TEST BLOOD SUGAR THREE TIMES DAILY for dx  ICD-10-E11.65 12/06/18   Delsa Grana, PA-C  Alcohol Swabs (B-D SINGLE USE SWABS REGULAR) PADS Apply 1 Units topically 4 (four) times daily. 12/06/18   Delsa Grana, PA-C  amLODipine (NORVASC) 5 MG tablet Take 1 tablet (5 mg total) by mouth daily. 09/23/18   Poulose, Bethel Born, NP  atenolol (TENORMIN) 50 MG tablet Take 1 tablet (50 mg total) by mouth 2 (two) times daily. 09/23/18   Poulose, Bethel Born, NP  Blood Glucose Monitoring Suppl (ACCU-CHEK NANO SMARTVIEW) w/Device KIT Disp one device per insurance for Dx ICD 10 E11.65 11/26/18   Delsa Grana, PA-C  diclofenac sodium (VOLTAREN) 1 % GEL Apply 2 grams to hands or 4 grams to knees up to 4 times a day; use just where needed Patient not taking: Reported on 11/19/2018 11/24/17   Arnetha Courser, MD  glucose blood test strip Test three times a day; Dx E11.65 11/26/18   Delsa Grana, PA-C  insulin NPH-regular Human (NOVOLIN 70/30) (70-30) 100 UNIT/ML injection Inject 90 Units into the skin 2 (two) times daily with a meal.     [provider]  Insulin Pen Needle (B-D ULTRAFINE  III SHORT PEN) 31G X 8 MM MISC For use with insulin pen, use once a day; LON 99 months; Dx E11.65 10/17/15   Arnetha Courser, MD  lisinopril (ZESTRIL) 40 MG tablet Take 1 tablet (40 mg total) by mouth daily. 09/23/18   Poulose, Bethel Born, NP  mupirocin ointment (BACTROBAN) 2 % APPLY TO AFFECTED AREA TWICE A DAY 01/02/17   [provider]  nitroGLYCERIN (NITROSTAT) 0.4 MG SL tablet Place 1 tablet (0.4 mg total) under the tongue every 5 (five) minutes as  needed for chest pain. Up to 3 doses; call 911 Patient not taking: Reported on 11/19/2018 09/23/18   Fredderick Severance, NP  rivaroxaban (XARELTO) 20 MG TABS tablet Take 20 mg by mouth daily.     [provider]  venlafaxine XR (EFFEXOR-XR) 150 MG 24 hr capsule Take 150 mg by mouth daily with breakfast.    [provider]    Allergies Statins  Family History  Problem Relation Age of Onset  . Anuerysm Father   . Depression Mother   . Clotting disorder Mother   . Depression Sister        taking venlafaxine  . Cancer Son        lung  . Heart attack Paternal Grandfather   . Depression Sister   . Stroke Brother   . Heart disease Neg Hx     Social History Social History   Tobacco Use  . Smoking status: Former Smoker    Types: Cigarettes    Quit date: 01/07/1991    Years since quitting: 28.2  . Smokeless tobacco: Former Systems developer    Types: Chew  . Tobacco comment: social smoker when he did smoke  Substance Use Topics  . Alcohol use: No    Alcohol/week: 0.0 standard drinks  . Drug use: No    Review of Systems Constitutional: Positive for fever/chills Eyes: No visual changes. ENT: No sore throat. Cardiovascular: Denies chest pain. Respiratory: Positive for cough and dyspnea Gastrointestinal: No abdominal pain.  No nausea, no vomiting.  No diarrhea.  No constipation. Genitourinary: Negative for dysuria. Musculoskeletal: Negative for neck pain.  Negative for back pain. Integumentary: Negative for rash. Neurological: Negative for headaches, focal weakness or numbness.   ____________________________________________   PHYSICAL EXAM:  VITAL SIGNS: ED Triage Vitals [03/24/19 1909]  Enc Vitals Group     BP 127/62     Pulse Rate 72     Resp 20     Temp 99.6 F (37.6 C)     Temp Source Oral     SpO2 93 %     Weight 102.1 kg (225 lb)     Height 1.854 m ('6\' 1"' )     Head Circumference      Peak Flow      Pain Score 0     Pain Loc      Pain Edu?       Excl. in [place]?     Constitutional: Alert and oriented.  Coughing, Eyes: Conjunctivae are normal.  Mouth/Throat: Patient is wearing a mask. Neck: No stridor.  No meningeal signs.   Cardiovascular: Normal rate, regular rhythm. Good peripheral circulation. Grossly normal heart sounds. Respiratory: Tachypnea, positive accessory respiratory muscle use, diffuse rhonchi bilaterally worse bibasilar Gastrointestinal: Soft and nontender. No distention.  Musculoskeletal: No lower extremity tenderness nor edema. No gross deformities of extremities. Neurologic:  Normal speech and language. No gross focal neurologic deficits are appreciated.  Skin:  Skin is warm, dry and intact.  Psychiatric: Mood and affect are normal. Speech and behavior are normal.  ____________________________________________   LABS (all labs ordered are listed, but only abnormal results are displayed)  Labs Reviewed  CBC - Abnormal; Notable for the following components:      Result Value   WBC 11.1 (*)    All other components within normal limits  COMPREHENSIVE METABOLIC PANEL - Abnormal; Notable for the following components:   Sodium 131 (*)    Potassium 5.3 (*)    Chloride 96 (*)    Glucose, Bld 183 (*)    BUN 29 (*)    Creatinine, Ser 1.66 (*)    Total Protein 8.2 (*)    AST 151 (*)    ALT 98 (*)    Total Bilirubin 2.4 (*)    GFR calc non Af Amer 39 (*)    GFR calc Af Amer 46 (*)    All other components within normal limits  LACTATE DEHYDROGENASE - Abnormal; Notable for the following components:   LDH 203 (*)    All other components within normal limits  FIBRIN DERIVATIVES D-DIMER ([facility] ONLY) - Abnormal; Notable for the following components:   Fibrin derivatives D-dimer ([facility]) 834.22 (*)    All other components within normal limits  FIBRINOGEN - Abnormal; Notable for the following components:   Fibrinogen 551 (*)    All other components within normal limits  PROCALCITONIN  C-REACTIVE PROTEIN  TROPONIN I (HIGH  SENSITIVITY)   ____________________________________________  EKG  ED ECG REPORT I, [place] N Marquee Fuchs, the attending physician, personally viewed and interpreted this ECG.   Date: 03/25/2019  EKG Time: 7:21 PM  Rate: 72  Rhythm: Normal sinus rhythm  Axis: Normal  Intervals: Normal  ST&T Change: None  ____________________________________________  RADIOLOGY I, [place] [facility] Jeslin Bazinet, personally viewed and evaluated these images (plain radiographs) as part of my medical decision making, as well as reviewing the written report by the radiologist.  ED MD interpretation: No focal consolidation noted on chest x-ray per radiologist.  Official radiology report(s): DG Chest 2 View  Result Date: 03/24/2019 CLINICAL DATA:  79 year old male with shortness of breath. EXAM: CHEST - 2 VIEW COMPARISON:  Chest radiograph dated 07/30/2017. FINDINGS: Minimal faint densities bilaterally likely artifactual and corresponding to the superimposition of the costochondral junction. There is no focal consolidation, pleural effusion, pneumothorax. Stable cardiac silhouette. No acute osseous pathology. IMPRESSION: No focal consolidation. Electronically Signed   By: Anner Crete M.D.   On: 03/24/2019 19:48    ____________________________________________   PROCEDURES     .Critical Care Performed by: Gregor Hams, MD Authorized by: Gregor Hams, MD   Critical care provider statement:    Critical care time (minutes):  30   Critical care time was exclusive of:  Separately billable procedures and treating other patients   Critical care was necessary to treat or prevent imminent or life-threatening deterioration of the following conditions:  Respiratory failure   Critical care was time spent personally by me on the following activities:  Development of treatment plan with patient or surrogate, discussions with consultants, evaluation of patient's response to treatment, examination of patient,  obtaining history from patient or surrogate, ordering and performing treatments and interventions, ordering and review of laboratory studies, ordering and review of radiographic studies, pulse oximetry, re-evaluation of patient's condition and review of old charts     ____________________________________________   [place] / [facility] / [place] / ED COURSE  As part of my medical decision making,  I reviewed the following data within the [place] NUMBER   79 year old male presented with above-stated history and physical exam concerning for COVID-19 associated pneumonia with hypoxia.  3 L of oxygen via nasal cannula applied to the patient with current oxygen saturation 95% however patient remains tachypnea.  Decadron 10 mg IV administered.  Remdesivir consult placed.  Patient discussed with hospitalist for admission for further evaluation and management. ____________________________________________  FINAL CLINICAL IMPRESSION(S) / ED DIAGNOSES  Final diagnoses:  COVID-19     MEDICATIONS GIVEN DURING THIS VISIT:  Medications  remdesivir 200 mg in sodium chloride 0.9% 250 mL IVPB (200 mg Intravenous New Bag/Given 03/24/19 2341)    Followed by  remdesivir 100 mg in sodium chloride 0.9 % 100 mL IVPB (has no administration in time range)  dexamethasone (DECADRON) injection 10 mg (10 mg Intravenous Given 03/24/19 2338)     ED Discharge Orders    None      *Please note:  DASHAN CHIZMAR was evaluated in Emergency Department on 03/25/2019 for the symptoms described in the history of present illness. He was evaluated in the context of the global COVID-19 pandemic, which necessitated consideration that the patient might be at risk for infection with the SARS-CoV-2 virus that causes COVID-19. Institutional protocols and algorithms that pertain to the evaluation of patients at risk for COVID-19 are in a state of rapid change based on information released by regulatory  bodies including the CDC and federal and state organizations. These policies and algorithms were followed during the patient's care in the ED.  Some ED evaluations and interventions may be delayed as a result of limited staffing during the pandemic.*  Note:  This document was prepared using Dragon voice recognition software and may include unintentional dictation errors.   Gregor Hams, MD 03/25/19 [phone]

## 2019-03-25 NOTE — Progress Notes (Signed)
Brief updated progress note.  Please see same day H&P for full details.  Briefly this is a 79 year old male with history of coronary artery disease, diabetes mellitus, hypertension who presents with chief complaint of weakness and shortness of breath was found to be Covid positive.  Started on remdesivir and Decadron therapy.  Remains on 2 L supplemental oxygen.  Overall on my evaluation feeling well.  We will continue current plan of care.  Lolita Patella MD

## 2019-03-26 DIAGNOSIS — E1159 Type 2 diabetes mellitus with other circulatory complications: Secondary | ICD-10-CM

## 2019-03-26 DIAGNOSIS — I10 Essential (primary) hypertension: Secondary | ICD-10-CM

## 2019-03-26 DIAGNOSIS — Z794 Long term (current) use of insulin: Secondary | ICD-10-CM

## 2019-03-26 LAB — CBC WITH DIFFERENTIAL/PLATELET
Abs Immature Granulocytes: 0.09 10*3/uL — ABNORMAL HIGH (ref 0.00–0.07)
Basophils Absolute: 0 10*3/uL (ref 0.0–0.1)
Basophils Relative: 0 %
Eosinophils Absolute: 0 10*3/uL (ref 0.0–0.5)
Eosinophils Relative: 0 %
HCT: 46.4 % (ref 39.0–52.0)
Hemoglobin: 15.4 g/dL (ref 13.0–17.0)
Immature Granulocytes: 1 %
Lymphocytes Relative: 14 %
Lymphs Abs: 1.5 10*3/uL (ref 0.7–4.0)
MCH: 30.7 pg (ref 26.0–34.0)
MCHC: 33.2 g/dL (ref 30.0–36.0)
MCV: 92.6 fL (ref 80.0–100.0)
Monocytes Absolute: 0.9 10*3/uL (ref 0.1–1.0)
Monocytes Relative: 9 %
Neutro Abs: 8.2 10*3/uL — ABNORMAL HIGH (ref 1.7–7.7)
Neutrophils Relative %: 76 %
Platelets: 156 10*3/uL (ref 150–400)
RBC: 5.01 MIL/uL (ref 4.22–5.81)
RDW: 13.2 % (ref 11.5–15.5)
WBC: 10.7 10*3/uL — ABNORMAL HIGH (ref 4.0–10.5)
nRBC: 0 % (ref 0.0–0.2)

## 2019-03-26 LAB — COMPREHENSIVE METABOLIC PANEL
ALT: 61 U/L — ABNORMAL HIGH (ref 0–44)
AST: 57 U/L — ABNORMAL HIGH (ref 15–41)
Albumin: 3.7 g/dL (ref 3.5–5.0)
Alkaline Phosphatase: 61 U/L (ref 38–126)
Anion gap: 11 (ref 5–15)
BUN: 30 mg/dL — ABNORMAL HIGH (ref 8–23)
CO2: 23 mmol/L (ref 22–32)
Calcium: 8.8 mg/dL — ABNORMAL LOW (ref 8.9–10.3)
Chloride: 101 mmol/L (ref 98–111)
Creatinine, Ser: 1.08 mg/dL (ref 0.61–1.24)
GFR calc Af Amer: >60 mL/min (ref >60)
GFR calc non Af Amer: >60 mL/min (ref >60)
Glucose, Bld: 198 mg/dL — ABNORMAL HIGH (ref 70–99)
Potassium: 4.6 mmol/L (ref 3.5–5.1)
Sodium: 135 mmol/L (ref 135–145)
Total Bilirubin: 2 mg/dL — ABNORMAL HIGH (ref 0.3–1.2)
Total Protein: 7.2 g/dL (ref 6.5–8.1)

## 2019-03-26 LAB — GLUCOSE, CAPILLARY
Glucose-Capillary: 217 mg/dL — ABNORMAL HIGH (ref 70–99)
Glucose-Capillary: 303 mg/dL — ABNORMAL HIGH (ref 70–99)
Glucose-Capillary: 282 mg/dL — ABNORMAL HIGH (ref 70–99)

## 2019-03-26 LAB — C-REACTIVE PROTEIN: CRP: 1.4 mg/dL — ABNORMAL HIGH (ref <1.0)

## 2019-03-26 LAB — FERRITIN: Ferritin: 498 ng/mL — ABNORMAL HIGH (ref 24–336)

## 2019-03-26 LAB — FIBRIN DERIVATIVES D-DIMER (ARMC ONLY): Fibrin derivatives D-dimer (ARMC): 504.88 ng/mL (FEU) — ABNORMAL HIGH (ref 0.00–499.00)

## 2019-03-26 MED ORDER — INSULIN ASPART PROT & ASPART (70-30 MIX) 100 UNIT/ML ~~LOC~~ SUSP
55.0000 [IU] | Freq: Two times a day (BID) | SUBCUTANEOUS | Status: DC
  Administered 2019-03-26 – 2019-03-27 (×2): 55 [IU] via SUBCUTANEOUS
  Filled 2019-03-26 (×3): qty 10

## 2019-03-26 MED ORDER — LISINOPRIL 20 MG PO TABS
20.0000 mg | ORAL_TABLET | Freq: Every day | ORAL | Status: DC
  Administered 2019-03-26 – 2019-03-27 (×2): 20 mg via ORAL
  Filled 2019-03-26: qty 1

## 2019-03-26 NOTE — Progress Notes (Addendum)
Notified by NT that patient was very upset about his blood sugar when she was checking it. Patient reported that meds we were giving were not working and he needed to take the same insulin as he did at home. This Rn went into room to discuss care with patient. Communicated to patient that he was on steroids as part of his treatment which would increase his blood sugar. Also instructed that he was being given insulin dosing similar to what he takes at home however on a sliding scale. Patient reports that he was not aware he was getting medication that would raise his blood sugar and that made sense to him as to why it was not under the same control he has at home. He was also concerned about not being on his lisinopril. The provider was contacted and reported that it was due to AKI. I communicated to patient that it was due to his elevated kideny function. Patient was very understanding and appreciative of the explanation.

## 2019-03-26 NOTE — Progress Notes (Signed)
Notified by NT that patient reported he had home meds with him. This RN went into room to discuss with patient. He did not show meds and none were observed. He did acknowledge that he had the meds. This RN explained why we needed to hold them for him until discharge. He states "I promise I won't take them but you are not getting them." Reiterated to patient again that he should not take the medication in case it interfered with anything we were giving him and that he should allow me to take them and keep them in our med room until discharge. He again refused.

## 2019-03-26 NOTE — Progress Notes (Signed)
PROGRESS NOTE    Drew Morgan  PJA:[id number] DOB: [birth year] DOA: 03/24/2019 PCP: Delsa Grana, PA-C    Brief Narrative:  Drew Morgan  is a 79 y.o. Caucasian male with a known history of multiple medical problems including coronary artery disease, type diabetes mellitus, hypertension, dyslipidemia and obstructive sleep apnea, who presented to the emergency room with acute onset of worsening dyspnea with associated cough and generalized weakness as well as vomiting.  He was was tested for COVID-19 on Monday and had the test result positive on Tuesday.  He denied any fever or chills however admits to loss of taste.  No diarrhea or bleeding diathesis.  No chest pain or palpitations.  No dysuria, oliguria or hematuria or flank pain.  Upon presentation to the emergency room, respiratory to 22 and pulse symmetry was 88% on room air and it went up to 96% on 2 L O2 by nasal cannula.  Labs revealed potassium 5.3 and glucose of 183 with a BUN of 29 creatinine 1.66 with normal previous levels AST 131 ALT 98 with troponin of 8.2 and total bilirubin of 2.4.  High-sensitivity troponin I was 10.  Procalcitonin was 0.1 CBC showed total mild Oxydose of 11.1.  Chest x-ray showed no acute cardiopulmonary disease.  UA showed Normal sinus rhythm with rate of 72 with nonspecific T wave changes.  1/16: Patient seen and examined.  He was irritated yesterday due to some changes in the medications that were not fully explained to him.  I had a lengthy conversation with him today.  Explained all the medications that he was getting.  All questions answered to patient satisfaction.  Clinically patient is doing well.  Remains on 2 L however feels the shortness of breath is improving.   Assessment & Plan:   Active Problems:   COVID-19  COVID-19 with associated hypoxemic acute respiratory failure. Patient remains on 2 L supplemental oxygen Clinically feels like his shortness of breath is improving Plan: Continue  remdesivir (day 3/5) Continue Decadron (day 3/10) Supplementary oxygen, titrate astolerated Problem as tolerated Incentive spirometry/flutter use Covid isolation precautions  Acute kidney injury. Improved Suspect volume depletion in the setting of acute illness No IV fluids Monitor daily creatinine  Elevated LFTs. Improved  Type 2 diabetes mellitus. Patient's home regimen is 70/30 90 units twice daily Currently on 55 units twice daily Sliding scale coverage Diabetic coordinator following  Hypertension. Continue home amlodipine Continue home atenolol Restart home lisinopril  Coronary artery disease status post PCI and 2 stents. 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 Home Effexor And trazodone   DVT prophylaxis: Xarelto Code Status: Full Family Communication: none today Disposition Plan: Anticipate home   Consultants:   None  Procedures:   none  Antimicrobials:   Remdesivir (1/14-  )   Subjective: Patient examined .  No acute status changes overnight Remains on 2 L Respiratory status subjectively improved  Objective: Vitals:   03/25/19 1514 03/26/19 0100 03/26/19 0253 03/26/19 0743  BP: (!) 152/65  (!) 132/58 135/87  Pulse: 63   70  Resp: 18   18  Temp: 98.3 F (36.8 C) 98.6 F (37 C)  97.7 F (36.5 C)  TempSrc: Oral   Axillary  SpO2: 92%   100%  Weight:      Height:        Intake/Output Summary (Last 24 hours) at 03/26/2019 1456 Last data filed at 03/26/2019 1215 Gross per 24 hour  Intake 1456.29 ml  Output 600 ml  Net 856.29 ml   Filed Weights   03/24/19 1909 03/25/19 0154  Weight: 102.1 kg 100.7 kg    Examination:  General exam: Appears calm and comfortable  Respiratory system: Scattered crackles bilaterally.  Normal work of breathing Cardiovascular system: S1 & S2 heard, RRR. No JVD, murmurs, rubs, gallops or clicks. No pedal edema. Gastrointestinal system: Abdomen is  nondistended, soft and nontender. No organomegaly or masses felt. Normal bowel sounds heard. Central nervous system: Alert and oriented. No focal neurological deficits. Extremities: Symmetric 5 x 5 power. Skin: No rashes, lesions or ulcers Psychiatry: Judgement and insight appear normal. Mood & affect appropriate.     Data Reviewed: I have personally reviewed following labs and imaging studies  CBC: Recent Labs  Lab 03/24/19 1915 03/26/19 0602  WBC 11.1* 10.7*  NEUTROABS  --  8.2*  HGB 15.8 15.4  HCT 45.7 46.4  MCV 90.0 92.6  PLT 193 156   Basic Metabolic Panel: Recent Labs  Lab 03/24/19 1915 03/26/19 0602  NA 131* 135  K 5.3* 4.6  CL 96* 101  CO2 24 23  GLUCOSE 183* 198*  BUN 29* 30*  CREATININE 1.66* 1.08  CALCIUM 9.2 8.8*   GFR: Estimated Creatinine Clearance: 72.6 mL/min (by C-G formula based on SCr of 1.08 mg/dL). Liver Function Tests: Recent Labs  Lab 03/24/19 1915 03/26/19 0602  AST 151* 57*  ALT 98* 61*  ALKPHOS 64 61  BILITOT 2.4* 2.0*  PROT 8.2* 7.2  ALBUMIN 4.5 3.7   No results for input(s): LIPASE, AMYLASE in the last 168 hours. No results for input(s): AMMONIA in the last 168 hours. Coagulation Profile: No results for input(s): INR, PROTIME in the last 168 hours. Cardiac Enzymes: No results for input(s): CKTOTAL, CKMB, CKMBINDEX, TROPONINI in the last 168 hours. BNP (last 3 results) No results for input(s): PROBNP in the last 8760 hours. HbA1C: Recent Labs    03/25/19 0417  HGBA1C 7.9*   CBG: Recent Labs  Lab 03/25/19 1110 03/25/19 1637 03/25/19 2059 03/26/19 0739 03/26/19 1213  GLUCAP 278* 294* 267* 217* 303*   Lipid Profile: No results for input(s): CHOL, HDL, LDLCALC, TRIG, CHOLHDL, LDLDIRECT in the last 72 hours. Thyroid Function Tests: No results for input(s): TSH, T4TOTAL, FREET4, T3FREE, THYROIDAB in the last 72 hours. Anemia Panel: Recent Labs    03/26/19 0602  FERRITIN 498*   Sepsis Labs: Recent Labs  Lab  03/24/19 2251  PROCALCITON 0.11    Recent Results (from the past 240 hour(s))  Novel Coronavirus, NAA ([facility])     Status: Abnormal   Collection Time: 03/21/19 12:58 PM   Specimen: Nasopharyngeal(NP) swabs in vial transport medium   NASOPHARYNGE  TESTING  Result Value Ref Range Status   SARS-CoV-2, NAA Detected (A) Not Detected Final    Comment: This nucleic acid amplification test was developed and its performance characteristics determined by [employer]. Nucleic acid amplification tests include PCR and TMA. This test has not been FDA cleared or approved. This test has been authorized by FDA under an Emergency Use Authorization (EUA). This test is only authorized for the duration of time the declaration that circumstances exist justifying the authorization of the emergency use of in vitro diagnostic tests for detection of SARS-CoV-2 virus and/or diagnosis of COVID-19 infection under section 564(b)(1) of the Act, 21 U.S.C. 416SAY-3(K) (1), unless the authorization is terminated or revoked sooner. When diagnostic testing is negative, the possibility of a false negative result should be considered in the  context of a patient's recent exposures and the presence of clinical signs and symptoms consistent with COVID-19. An individual without symptoms of COVID-19 and who is not shedding SARS-CoV-2 virus would  expect to have a negative (not detected) result in this assay.          Radiology Studies: DG Chest 2 View  Result Date: 03/24/2019 CLINICAL DATA:  79 year old male with shortness of breath. EXAM: CHEST - 2 VIEW COMPARISON:  Chest radiograph dated 07/30/2017. FINDINGS: Minimal faint densities bilaterally likely artifactual and corresponding to the superimposition of the costochondral junction. There is no focal consolidation, pleural effusion, pneumothorax. Stable cardiac silhouette. No acute osseous pathology. IMPRESSION: No focal consolidation. Electronically Signed    By: Elgie Collard M.D.   On: 03/24/2019 19:48        Scheduled Meds: . amLODipine  5 mg Oral Daily  . aspirin EC  81 mg Oral Daily  . atenolol  50 mg Oral BID  . cholecalciferol  1,000 Units Oral Daily  . dexamethasone (DECADRON) injection  6 mg Intravenous Q24H  . guaiFENesin  600 mg Oral BID  . insulin aspart  0-15 Units Subcutaneous TID AC & HS  . insulin aspart protamine- aspart  55 Units Subcutaneous BID WC  . lisinopril  20 mg Oral Daily  . mupirocin ointment   Topical BID  . rivaroxaban  20 mg Oral Daily  . venlafaxine XR  150 mg Oral Q breakfast   Continuous Infusions: . remdesivir 100 mg in NS 100 mL 100 mg (03/26/19 1003)     LOS: 1 day    Time spent: 35 minutes    Tresa Moore, MD [facility] Pager [phone]  If 7PM-7AM, please contact night-coverage [email or web address] Password [facility] 03/26/2019, 2:56 PM

## 2019-03-27 LAB — CBC WITH DIFFERENTIAL/PLATELET
Abs Immature Granulocytes: 0.11 10*3/uL — ABNORMAL HIGH (ref 0.00–0.07)
Basophils Absolute: 0 10*3/uL (ref 0.0–0.1)
Basophils Relative: 0 %
Eosinophils Absolute: 0 10*3/uL (ref 0.0–0.5)
Eosinophils Relative: 0 %
HCT: 44.6 % (ref 39.0–52.0)
Hemoglobin: 14.9 g/dL (ref 13.0–17.0)
Immature Granulocytes: 1 %
Lymphocytes Relative: 18 %
Lymphs Abs: 1.7 10*3/uL (ref 0.7–4.0)
MCH: 30.6 pg (ref 26.0–34.0)
MCHC: 33.4 g/dL (ref 30.0–36.0)
MCV: 91.6 fL (ref 80.0–100.0)
Monocytes Absolute: 1.2 10*3/uL — ABNORMAL HIGH (ref 0.1–1.0)
Monocytes Relative: 12 %
Neutro Abs: 6.7 10*3/uL (ref 1.7–7.7)
Neutrophils Relative %: 69 %
Platelets: 173 10*3/uL (ref 150–400)
RBC: 4.87 MIL/uL (ref 4.22–5.81)
RDW: 13.1 % (ref 11.5–15.5)
WBC: 9.7 10*3/uL (ref 4.0–10.5)
nRBC: 0 % (ref 0.0–0.2)

## 2019-03-27 LAB — COMPREHENSIVE METABOLIC PANEL
ALT: 50 U/L — ABNORMAL HIGH (ref 0–44)
AST: 41 U/L (ref 15–41)
Albumin: 3.7 g/dL (ref 3.5–5.0)
Alkaline Phosphatase: 58 U/L (ref 38–126)
Anion gap: 9 (ref 5–15)
BUN: 30 mg/dL — ABNORMAL HIGH (ref 8–23)
CO2: 27 mmol/L (ref 22–32)
Calcium: 8.7 mg/dL — ABNORMAL LOW (ref 8.9–10.3)
Chloride: 101 mmol/L (ref 98–111)
Creatinine, Ser: 1.1 mg/dL (ref 0.61–1.24)
GFR calc Af Amer: >60 mL/min (ref >60)
GFR calc non Af Amer: >60 mL/min (ref >60)
Glucose, Bld: 218 mg/dL — ABNORMAL HIGH (ref 70–99)
Potassium: 4.8 mmol/L (ref 3.5–5.1)
Sodium: 137 mmol/L (ref 135–145)
Total Bilirubin: 1.9 mg/dL — ABNORMAL HIGH (ref 0.3–1.2)
Total Protein: 7.1 g/dL (ref 6.5–8.1)

## 2019-03-27 LAB — FIBRIN DERIVATIVES D-DIMER (ARMC ONLY): Fibrin derivatives D-dimer (ARMC): 438.29 ng/mL (FEU) (ref 0.00–499.00)

## 2019-03-27 LAB — GLUCOSE, CAPILLARY
Glucose-Capillary: 245 mg/dL — ABNORMAL HIGH (ref 70–99)
Glucose-Capillary: 395 mg/dL — ABNORMAL HIGH (ref 70–99)

## 2019-03-27 LAB — C-REACTIVE PROTEIN: CRP: 0.8 mg/dL (ref <1.0)

## 2019-03-27 LAB — FERRITIN: Ferritin: 402 ng/mL — ABNORMAL HIGH (ref 24–336)

## 2019-03-27 MED ORDER — DEXAMETHASONE 6 MG PO TABS: 6.0000 mg | ORAL_TABLET | Freq: Four times a day (QID) | ORAL | 0 refills | Status: AC

## 2019-03-27 NOTE — Evaluation (Signed)
Physical Therapy Evaluation Patient Details Name: Drew Morgan MRN: [id number] DOB: [birth year] Today's Date: 03/27/2019   History of Present Illness  Pt admitted for complaints of dyspnea with cough/weakness/vomiting. Pt now admitted for Covid. History includes CAD, DM, and HTN.  Clinical Impression  Pt is a pleasant 79 year old male who was admitted for covid. Pt performs bed mobility with independence, transfers with mod I, and ambulation with supervision and RW. Pt does become slightly SOB with exertion as he demonstrates quick pace. Encouraged energy pacing and educated on safe technique by monitoring O2 sats at home. Pt then ambulated again in room with ability to maintain sats on RA. Pt does not require any further PT needs at this time. Pt will be dc in house and does not require follow up. RN aware. Will dc current orders.     Follow Up Recommendations No PT follow up    Equipment Recommendations  None recommended by PT    Recommendations for Other Services       Precautions / Restrictions Precautions Precautions: Fall Restrictions Weight Bearing Restrictions: No      Mobility  Bed Mobility Overal bed mobility: Independent             General bed mobility comments: safe technique  Transfers Overall transfer level: Modified independent Equipment used: Rolling walker (2 wheeled)             General transfer comment: safe technique with upright posture. RW used  Ambulation/Gait Ambulation/Gait assistance: Supervision Gait Distance (Feet): 80 Feet Assistive device: Rolling walker (2 wheeled) Gait Pattern/deviations: Step-through pattern     General Gait Details: ambulated two laps in room. Pt with O2 on RA with sats WNL. Safe technique with reciprocal gait pattern noted. Slightly wide stance.  Stairs            Wheelchair Mobility    Modified Rankin (Stroke Patients Only)       Balance Overall balance assessment: Modified Independent                                            Pertinent Vitals/Pain Pain Assessment: No/denies pain    Home Living Family/patient expects to be discharged to:: Private residence Living Arrangements: (lives with ex-wife) Available Help at Discharge: Family;Available 24 hours/day Type of Home: Apartment Home Access: Stairs to enter Entrance Stairs-Rails: Can reach both Entrance Stairs-Number of Steps: 2 Home Layout: One level Home Equipment: Walker - 2 wheels;Cane - single point(pulse ox)      Prior Function Level of Independence: Independent         Comments: occasionally will use AD secondary to B knee pain, however reports no falls and states he is generally active     Hand Dominance        Extremity/Trunk Assessment   Upper Extremity Assessment Upper Extremity Assessment: Overall WFL for tasks assessed    Lower Extremity Assessment Lower Extremity Assessment: Overall WFL for tasks assessed       Communication   Communication: No difficulties  Cognition Arousal/Alertness: Awake/alert Behavior During Therapy: WFL for tasks assessed/performed Overall Cognitive Status: Within Functional Limits for tasks assessed                                        General Comments  Exercises     Assessment/Plan    PT Assessment Patent does not need any further PT services  PT Problem List         PT Treatment Interventions      PT Goals (Current goals can be found in the Care Plan section)  Acute Rehab PT Goals Patient Stated Goal: to go home PT Goal Formulation: All assessment and education complete, DC therapy Time For Goal Achievement: 03/27/19 Potential to Achieve Goals: Good    Frequency     Barriers to discharge        Co-evaluation               AM-PAC PT "6 Clicks" Mobility  Outcome Measure Help needed turning from your back to your side while in a flat bed without using bedrails?: None Help needed  moving from lying on your back to sitting on the side of a flat bed without using bedrails?: None Help needed moving to and from a bed to a chair (including a wheelchair)?: None Help needed standing up from a chair using your arms (e.g., wheelchair or bedside chair)?: None Help needed to walk in hospital room?: None Help needed climbing 3-5 steps with a railing? : None 6 Click Score: 24    End of Session Equipment Utilized During Treatment: Gait belt Activity Tolerance: Patient tolerated treatment well Patient left: in chair(mod fall) Nurse Communication: Mobility status PT Visit Diagnosis: Unsteadiness on feet (R26.81)    Time: 7939-0300 PT Time Calculation (min) (ACUTE ONLY): 27 min   Charges:   PT Evaluation $PT Eval Low Complexity: 1 Low PT Treatments $Gait Training: 8-22 mins        Greggory Stallion, PT, DPT [phone]   Drew Morgan 03/27/2019, 1:20 PM

## 2019-03-27 NOTE — Discharge Summary (Addendum)
Physician Discharge Summary  Drew Morgan RCV:[id number] DOB: 07-May-1940 DOA: 03/24/2019  PCP: Drew Grana, PA-C  Admit date: 03/24/2019 Discharge date: 03/27/2019  Admitted From: Home Disposition: Home  Recommendations for Outpatient Follow-up:  1. Follow up with PCP in 1-2 weeks   Home Health: No Equipment/Devices: None Discharge Condition: Stable CODE STATUS: Full Diet recommendation: Carb modified  Brief/Interim Summary: [name] a79 y.o.Caucasian malewith a known history of multiple medical problems including coronary artery disease, type diabetes mellitus, hypertension, dyslipidemia and obstructive sleep apnea, who presented to the emergency room with acute onset of worsening dyspnea with associated cough and generalized weakness as well as vomiting. He was was tested for COVID-19 on Monday and had the test result positive on Tuesday. He denied any fever or chills however admits to loss of taste. No diarrhea or bleeding diathesis. No chest pain or palpitations. No dysuria, oliguria or hematuria or flank pain.  Upon presentation to the emergency room, respiratory to 22 and pulse symmetry was 88% on room air and it went up to 96% on 2 L O2 by nasal cannula. Labs revealed potassium 5.3 and glucose of 183 with a BUN of 29 creatinine 1.66 with normal previous levels AST 131 ALT 98 with troponin of 8.2 and total bilirubin of 2.4. High-sensitivity troponin I was 10. Procalcitonin was 0.1 CBC showed total mild Oxydose of 11.1. Chest x-ray showed no acute cardiopulmonary disease. UA showed Normal sinus rhythm with rate of 72 with nonspecific T wave changes.  1/16: Patient seen and examined.  He was irritated yesterday due to some changes in the medications that were not fully explained to him.  I had a lengthy conversation with him today.  Explained all the medications that he was getting.  All questions answered to patient satisfaction.  Clinically patient is doing  well.  Remains on 2 L however feels the shortness of breath is improving.  1/17: Patient seen and examined.  Weaned off supplemental oxygen has been stable on room air.  Working with PT.  No outpatient PT needs.  Patient medically stable for discharge at this time.  Discharge instructions explained at length the patient at bedside.  Patient expressed understanding.  Multiple times to call patient's grandson as listed in chart.  No answer and inability to leave voicemail  Discharge Diagnoses:  Active Problems:   COVID-19  COVID-19 with associated hypoxemicacute respiratory failure. Patient remains on 2 L supplemental oxygen Clinically feels like his shortness of breath is improving Completed 4 days of remdesivir in house Completed 4 days of Decadron in house, will prescribe additional 6 days on discharge No indication for post discharge oxygen therapy Patient educated on post discharge self quarantine and self monitoring Clear return to ED instructions provided  Acute kidney injury. Improved Suspect volume depletion in the setting of acute illness No IV fluids Monitor daily creatinine  Elevated LFTs. Improved  Type 2 diabetes mellitus. Patient's home regimen is 70/30 90 units twice daily Plan resume home regimen on discharge  Hypertension. On amlodipine, atenolol, lisinopril Lisinopril held in house due to AKI Can resume home regimen on discharge  Coronary artery disease status post PCI and 2 stents. 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 Home Effexor Home trazodone  Discharge Instructions  Discharge Instructions    Diet - low sodium heart healthy   Complete by: As directed    Discharge instructions   Complete by: As directed    I recommend self quarantining at  home for a period of 7 to 10 days post discharge.  Please monitor your oxygen level and your temperature twice a day.  Please do not share any time in  common areas, sleep in separate bedroom, use separate bathrooms or at separate times.  Please wear masks in the house if sharing with other family members.  If you develop any symptoms at home please call your physician or come back to the emergency department   Increase activity slowly   Complete by: As directed    MyChart COVID-19 home monitoring program   Complete by: Mar 27, 2019    Is the patient willing to use the [place] for home monitoring?: Yes   Temperature monitoring   Complete by: Mar 27, 2019    After how many days would you like to receive a notification of this patient's flowsheet entries?: 1     Allergies as of 03/27/2019      Reactions   Statins Other (See Comments)      Medication List    STOP taking these medications   diclofenac sodium 1 % Gel Commonly known as: VOLTAREN     TAKE these medications   Accu-Chek FastClix Lancets Misc TEST BLOOD SUGAR THREE TIMES DAILY for dx  ICD-10-E11.65   Accu-Chek Nano SmartView w/Device Kit Disp one device per insurance for Dx ICD 10 E11.65   amLODipine 5 MG tablet Commonly known as: NORVASC Take 1 tablet (5 mg total) by mouth daily.   atenolol 50 MG tablet Commonly known as: TENORMIN Take 1 tablet (50 mg total) by mouth 2 (two) times daily.   B-D SINGLE USE SWABS REGULAR Pads Apply 1 Units topically 4 (four) times daily.   dexamethasone 6 MG tablet Commonly known as: Decadron Take 1 tablet (6 mg total) by mouth every 6 (six) hours for 6 days. Start taking on: March 28, 2019   glucose blood test strip Test three times a day; Dx E11.65   insulin NPH-regular Human (70-30) 100 UNIT/ML injection Inject 90 Units into the skin 2 (two) times daily with a meal.   Insulin Pen Needle 31G X 8 MM Misc Commonly known as: B-D ULTRAFINE III SHORT PEN For use with insulin pen, use once a day; LON 99 months; Dx E11.65   lisinopril 40 MG tablet Commonly known as: ZESTRIL Take 1 tablet (40 mg total) by mouth  daily.   mupirocin ointment 2 % Commonly known as: BACTROBAN APPLY TO AFFECTED AREA TWICE A DAY   nitroGLYCERIN 0.4 MG SL tablet Commonly known as: NITROSTAT Place 1 tablet (0.4 mg total) under the tongue every 5 (five) minutes as needed for chest pain. Up to 3 doses; call 911   rivaroxaban 20 MG Tabs tablet Commonly known as: XARELTO Take 20 mg by mouth daily.   venlafaxine XR 150 MG 24 hr capsule Commonly known as: EFFEXOR-XR Take 150 mg by mouth daily with breakfast.       Allergies  Allergen Reactions  . Statins Other (See Comments)    Consultations:  None   Procedures/Studies: DG Chest 2 View  Result Date: 03/24/2019 CLINICAL DATA:  79 year old male with shortness of breath. EXAM: CHEST - 2 VIEW COMPARISON:  Chest radiograph dated 07/30/2017. FINDINGS: Minimal faint densities bilaterally likely artifactual and corresponding to the superimposition of the costochondral junction. There is no focal consolidation, pleural effusion, pneumothorax. Stable cardiac silhouette. No acute osseous pathology. IMPRESSION: No focal consolidation. Electronically Signed   By: Laren Everts.D.  On: 03/24/2019 19:48    (Echo, Carotid, EGD, Colonoscopy, ERCP)    Subjective: Seen and examined on the day of discharge Feels well no complaints Ambulating around room without issue No desaturation events Cleared by PT for discharge home  Discharge Exam: Vitals:   03/27/19 0202 03/27/19 1009  BP: (!) 108/59 (!) 143/64  Pulse:    Resp:    Temp: (!) 97.5 F (36.4 C)   SpO2:     Vitals:   03/26/19 1800 03/26/19 2120 03/27/19 0202 03/27/19 1009  BP:  (!) 153/62 (!) 108/59 (!) 143/64  Pulse: 71 67    Resp: (!) 23 18    Temp:   (!) 97.5 F (36.4 C)   TempSrc:   Oral   SpO2: 91% 92%    Weight:      Height:        General: Pt is alert, awake, not in acute distress Cardiovascular: RRR, S1/S2 +, no rubs, no gallops Respiratory: Mild scattered crackles, normal work of  breathing Abdominal: Soft, NT, ND, bowel sounds + Extremities: no edema, no cyanosis    The results of significant diagnostics from this hospitalization (including imaging, microbiology, ancillary and laboratory) are listed below for reference.     Microbiology: Recent Results (from the past 240 hour(s))  Novel Coronavirus, NAA ([facility])     Status: Abnormal   Collection Time: 03/21/19 12:58 PM   Specimen: Nasopharyngeal(NP) swabs in vial transport medium   NASOPHARYNGE  TESTING  Result Value Ref Range Status   SARS-CoV-2, NAA Detected (A) Not Detected Final    Comment: This nucleic acid amplification test was developed and its performance characteristics determined by [employer], [employer] and [employer]. Nucleic acid amplification tests include PCR and TMA. This test has not been FDA cleared or approved. This test has been authorized by FDA under an Emergency Use Authorization (EUA). This test is only authorized for the duration of time the declaration that circumstances exist justifying the authorization of the emergency use of in vitro diagnostic tests for detection of SARS-CoV-2 virus and/or diagnosis of COVID-19 infection under section 564(b)(1) of the Act, 21 U.S.C. 004HTX-7(F) (1), unless the authorization is terminated or revoked sooner. When diagnostic testing is negative, the possibility of a false negative result should be considered in the context of a patient's recent exposures and the presence of clinical signs and symptoms consistent with COVID-19. An individual without symptoms of COVID-19 and who is not shedding SARS-CoV-2 virus would  expect to have a negative (not detected) result in this assay.      Labs: BNP (last 3 results) No results for input(s): BNP in the last 8760 hours. Basic Metabolic Panel: Recent Labs  Lab 03/24/19 1915 03/26/19 0602 03/27/19 0447  NA 131* 135 137  K 5.3* 4.6 4.8  CL 96* 101 101  CO2 '24 23 27  ' GLUCOSE 183* 198* 218*  BUN 29* 30* 30*   CREATININE 1.66* 1.08 1.10  CALCIUM 9.2 8.8* 8.7*   Liver Function Tests: Recent Labs  Lab 03/24/19 1915 03/26/19 0602 03/27/19 0447  AST 151* 57* 41  ALT 98* 61* 50*  ALKPHOS 64 61 58  BILITOT 2.4* 2.0* 1.9*  PROT 8.2* 7.2 7.1  ALBUMIN 4.5 3.7 3.7   No results for input(s): LIPASE, AMYLASE in the last 168 hours. No results for input(s): AMMONIA in the last 168 hours. CBC: Recent Labs  Lab 03/24/19 1915 03/26/19 0602 03/27/19 0447  WBC 11.1* 10.7* 9.7  NEUTROABS  --  8.2* 6.7  HGB 15.8  15.4 14.9  HCT 45.7 46.4 44.6  MCV 90.0 92.6 91.6  PLT 193 156 173   Cardiac Enzymes: No results for input(s): CKTOTAL, CKMB, CKMBINDEX, TROPONINI in the last 168 hours. BNP: Invalid input(s): POCBNP CBG: Recent Labs  Lab 03/26/19 0739 03/26/19 1213 03/26/19 2110 03/27/19 0804 03/27/19 1137  GLUCAP 217* 303* 282* 245* 395*   D-Dimer No results for input(s): DDIMER in the last 72 hours. Hgb A1c Recent Labs    03/25/19 0417  HGBA1C 7.9*   Lipid Profile No results for input(s): CHOL, HDL, LDLCALC, TRIG, CHOLHDL, LDLDIRECT in the last 72 hours. Thyroid function studies No results for input(s): TSH, T4TOTAL, T3FREE, THYROIDAB in the last 72 hours.  Invalid input(s): FREET3 Anemia work up [employer]    03/26/19 0602 03/27/19 0447  FERRITIN 498* 402*   Urinalysis No results found for: COLORURINE, APPEARANCEUR, LABSPEC, [place], GLUCOSEU, [place], [place], [place], PROTEINUR, UROBILINOGEN, NITRITE, LEUKOCYTESUR Sepsis Labs Invalid input(s): PROCALCITONIN,  WBC,  LACTICIDVEN Microbiology Recent Results (from the past 240 hour(s))  Novel Coronavirus, NAA ([facility])     Status: Abnormal   Collection Time: 03/21/19 12:58 PM   Specimen: Nasopharyngeal(NP) swabs in vial transport medium   NASOPHARYNGE  TESTING  Result Value Ref Range Status   SARS-CoV-2, NAA Detected (A) Not Detected Final    Comment: This nucleic acid amplification test was developed and its  performance characteristics determined by [employer], [employer] and [employer]. Nucleic acid amplification tests include PCR and TMA. This test has not been FDA cleared or approved. This test has been authorized by FDA under an Emergency Use Authorization (EUA). This test is only authorized for the duration of time the declaration that circumstances exist justifying the authorization of the emergency use of in vitro diagnostic tests for detection of SARS-CoV-2 virus and/or diagnosis of COVID-19 infection under section 564(b)(1) of the Act, 21 U.S.C. 967SWV-7(V) (1), unless the authorization is terminated or revoked sooner. When diagnostic testing is negative, the possibility of a false negative result should be considered in the context of a patient's recent exposures and the presence of clinical signs and symptoms consistent with COVID-19. An individual without symptoms of COVID-19 and who is not shedding SARS-CoV-2 virus would  expect to have a negative (not detected) result in this assay.      Time coordinating discharge: Over 30 minutes  SIGNED:   Sidney Ace, MD  [facility] 03/27/2019, 12:57 PM Pager   If 7PM-7AM, please contact night-coverage [email or web address] Password [email or web address]

## 2019-04-01 ENCOUNTER — Ambulatory Visit (INDEPENDENT_AMBULATORY_CARE_PROVIDER_SITE_OTHER): Payer: Medicare HMO | Admitting: Family Medicine

## 2019-04-01 ENCOUNTER — Other Ambulatory Visit: Payer: Self-pay

## 2019-04-01 ENCOUNTER — Encounter: Payer: Self-pay | Admitting: Family Medicine

## 2019-04-01 VITALS — Ht 72.0 in | Wt 222.0 lb

## 2019-04-01 DIAGNOSIS — M170 Bilateral primary osteoarthritis of knee: Secondary | ICD-10-CM | POA: Diagnosis not present

## 2019-04-01 DIAGNOSIS — Z09 Encounter for follow-up examination after completed treatment for conditions other than malignant neoplasm: Secondary | ICD-10-CM

## 2019-04-01 DIAGNOSIS — J069 Acute upper respiratory infection, unspecified: Secondary | ICD-10-CM

## 2019-04-01 DIAGNOSIS — U071 COVID-19: Secondary | ICD-10-CM

## 2019-04-01 NOTE — Progress Notes (Signed)
Name: Drew Morgan   MRN: [id number]    DOB: 1940/05/21   Date:04/01/2019       Progress Note  Subjective:    Chief Complaint  Chief Complaint  Patient presents with  . Hospitalization Follow-up    positive for covid, pt refused to take bp    I connected with  Blenda Peals  on 04/01/19 at 10:00 AM EST by a video enabled telemedicine application and verified that I am speaking with the correct person using two identifiers.  I discussed the limitations of evaluation and management by telemedicine and the availability of in person appointments. The patient expressed understanding and agreed to proceed. Staff also discussed with the patient that there may be a patient responsible charge related to this service. Patient Location: home Provider Location: [facility] Additional Individuals present: none  HPI  Pt admitted to the hospital for [place] with respiratory sx and hypoxemic respiratory failure Admit on 03/27/2019 and discharge on 03/29/2019  Admitted for the following: COVID-19 with associated hypoxemicacute respiratory failure. No labs needed today He was instructed to f/up with PCP in 1-2 weeks He was on 2 L supplemental oxygen via nasal cannula but was able to discharge home without any O2 requirement did well on room air.  He was to complete 4d of remdesivir and 4 days decadron  He was instructed to quarantine at home -should have been instructed to isolate at home for 7 to 10 days post discharge, he has not had any concerning symptoms and he does feel much better only has some remaining fatigue He actually feels wonderful on his steroids and states that his knees have felt better than they have in a long time he actually wants to continue steroids so that he can have improved mobility improved function. Patient has history of severe arthritis to his knees limiting his mobility, he has been assessed at the [facility] for this, he is terrified of having surgery because of  coagulopathy and a family member died having a simple surgery due to their coagulopathy.  He is practically begging during visit for more steroids because his knee pain is so much better than it has been for several years dealing with chronic pain management. 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  The few times I have seen the patient he does have evident chronic pain, difficulty with ambulation, uses a cane, we have discussed going to Ortho in the past but he refuses.  We have discussed total knee replacements surgery but he will even go back to Ortho to discuss.   Patient has a few more days of steroids  Patient Active Problem List   Diagnosis Date Noted  . COVID-19 03/25/2019  . Senile purpura ([place]) 09/08/2018  . Chronic obstructive pulmonary disease ([place]) 09/22/2017  . B12 deficiency 01/02/2017  . Memory difficulties 11/03/2016  . Stress due to family tension 07/02/2016  . Elevated serum glutamic pyruvic transaminase (SGPT) level 04/01/2016  . Coronary artery disease   . OSA (obstructive sleep apnea)   . Intermittent explosive disorder 07/30/2015  . ADHD (attention  deficit hyperactivity disorder) 07/30/2015  . Arthritis of both knees 07/30/2015  . Type 2 diabetes, uncontrolled, with neuropathy ([place]) 10/11/2014  . Essential hypertension 10/11/2014  . Hyperlipidemia 10/11/2014  . Factor V Leiden mutation ([place]) 10/11/2014    Social History   Tobacco Use  . Smoking status: Former Smoker    Types: Cigarettes    Quit date: 01/07/1991    Years since quitting: 28.2  . Smokeless tobacco: Former Systems developer    Types: Chew  . Tobacco comment: social  smoker when he did smoke  Substance Use Topics  . Alcohol use: No    Alcohol/week: 0.0 standard drinks     Current Outpatient Medications:  .  amLODipine (NORVASC) 5 MG tablet, Take 1 tablet (5 mg total) by mouth daily., Disp: 90 tablet, Rfl: 3 .  atenolol (TENORMIN) 50 MG tablet, Take 1 tablet (50 mg total) by mouth 2 (two) times daily., Disp: 180 tablet, Rfl: 3 .  dexamethasone (DECADRON) 6 MG tablet, Take 1 tablet (6 mg total) by mouth every 6 (six) hours for 6 days., Disp: 24 tablet, Rfl: 0 .  insulin NPH-regular Human (NOVOLIN 70/30) (70-30) 100 UNIT/ML injection, Inject 90 Units into the skin 2 (two) times daily with a meal. , Disp: , Rfl:  .  lisinopril (ZESTRIL) 40 MG tablet, Take 1 tablet (40 mg total) by mouth daily., Disp: 90 tablet, Rfl: 3 .  nitroGLYCERIN (NITROSTAT) 0.4 MG SL tablet, Place 1 tablet (0.4 mg total) under the tongue every 5 (five) minutes as needed for chest pain. Up to 3 doses; call 911, Disp: 25 tablet, Rfl: 1 .  rivaroxaban (XARELTO) 20 MG TABS tablet, Take 20 mg by mouth daily. , Disp: , Rfl:  .  venlafaxine XR (EFFEXOR-XR) 150 MG 24 hr capsule, Take 150 mg by mouth daily with breakfast., Disp: , Rfl:  .  Accu-Chek FastClix Lancets MISC, TEST BLOOD SUGAR THREE TIMES DAILY for dx  ICD-10-E11.65, Disp: 100 each, Rfl: 11 .  Alcohol Swabs (B-D SINGLE USE SWABS REGULAR) PADS, Apply 1 Units topically 4 (four) times daily., Disp: 100 each, Rfl: 5 .  Blood Glucose Monitoring Suppl (ACCU-CHEK NANO SMARTVIEW) w/Device KIT, Disp one device per insurance for Dx ICD 10 E11.65, Disp: 1 kit, Rfl: 0 .  glucose blood test strip, Test three times a day; Dx E11.65, Disp: 100 each, Rfl: 11 .  Insulin Pen Needle (B-D ULTRAFINE III SHORT PEN) 31G X 8 MM MISC, For use with insulin pen, use once a day; LON 99 months; Dx E11.65, Disp: 100 each, Rfl: 3 .  mupirocin ointment (BACTROBAN) 2 %, APPLY TO AFFECTED AREA TWICE A DAY, Disp: , Rfl: 0  Allergies  Allergen Reactions  . Statins  Other (See Comments)    I personally reviewed active problem list, medication list, allergies, family history, social history, health maintenance, notes from last encounter, lab results, endoscopy procedure notes, imaging with the patient/caregiver today.  Review of Systems  Constitutional: Negative.   HENT: Negative.   Eyes: Negative.   Respiratory: Negative.   Cardiovascular: Negative.   Gastrointestinal: Negative.   Endocrine: Negative.   Genitourinary: Negative.   Musculoskeletal: Negative.   Skin: Negative.   Allergic/Immunologic: Negative.   Neurological: Negative.   Hematological: Negative.   Psychiatric/Behavioral: Negative.   All other systems reviewed and are negative.    Objective:   Virtual encounter, vitals limited, only able to obtain the following Today's Vitals   04/01/19 0955  Weight: 222 lb (100.7 kg)  Height: 6' (  1.829 m)   Body mass index is 30.11 kg/m. Nursing Note and Vital Signs reviewed.  Physical Exam Phonation clear, no audible respiratory distress tachypnea or audible wheeze PE limited by telephone encounter  No results found for this or any previous visit (from the past 72 hour(s)).  Assessment and Plan:     ICD-10-CM   1. Acute respiratory disease due to COVID-19 virus  U07.1 dexamethasone (DECADRON) 6 MG tablet  Improving sx.   Isolate until 04/04/2019, must have improving respiratory sx and no fever F/up in office as needed and as able to per [facility]  policy for Covid positive patients  J06.9   2. Osteoarthritis of both knees, unspecified osteoarthritis type  M17.0 Ambulatory referral to Orthopedic Surgery   pt laments severe pain and how it limits his function and quality of life, urged him many time to follow up with ortho - he wants steroids since it has let him function better.  I explained to him how occassional burst of steroids for severe OA flares or knee injections with steroids can be used to help him have decreased pain and  improved function, but he was very adament about having chronic steroids, since he started taking them for COVID his knees are better, explained how chornic steroids for OA would not be indicated nor worth the risk, I would only prescribe a short taper for after dexamethasone was done from hospital tx.    3. Encounter for examination following treatment at hospital  Z09    pt admitted for [place] with acute resp failure, requiring O2, improving, on high dose steroids, improving, steroids have helped chronic knee pain     -Red flags and when to present for emergency care or RTC including fever >101.75F, chest pain, shortness of breath, new/worsening/un-resolving symptoms,  reviewed with patient at time of visit. Follow up and care instructions discussed and provided in AVS. - I discussed the assessment and treatment plan with the patient. The patient was provided an opportunity to ask questions and all were answered. The patient agreed with the plan and demonstrated an understanding of the instructions.  I provided 28 minutes of non-face-to-face time during this encounter.  Delsa Grana, PA-C 04/01/19 10:33 AM

## 2019-04-04 ENCOUNTER — Other Ambulatory Visit: Payer: Self-pay

## 2019-04-04 ENCOUNTER — Encounter: Payer: Self-pay | Admitting: Family Medicine

## 2019-04-04 ENCOUNTER — Telehealth: Payer: Self-pay

## 2019-04-04 MED ORDER — DEXAMETHASONE 6 MG PO TABS: ORAL_TABLET | ORAL | 0 refills | Status: AC

## 2019-04-04 NOTE — Telephone Encounter (Signed)
Copied from CRM [id number]. Topic: General - Inquiry >> Apr 04, 2019  2:35 PM Drew Morgan wrote: Reason for CRM:  Pt states that he had a phone visit with Leisa on Friday and was told steroids would be called in for him, but the pharmacy still has nothing.  Pt would like to know what is going on.

## 2019-04-05 NOTE — Telephone Encounter (Signed)
Left detailed voicemail

## 2019-04-15 DIAGNOSIS — R079 Chest pain, unspecified: Secondary | ICD-10-CM | POA: Diagnosis not present

## 2019-04-15 DIAGNOSIS — I499 Cardiac arrhythmia, unspecified: Secondary | ICD-10-CM | POA: Diagnosis not present

## 2019-04-15 DIAGNOSIS — I4510 Unspecified right bundle-branch block: Secondary | ICD-10-CM | POA: Diagnosis not present

## 2019-04-15 DIAGNOSIS — R0789 Other chest pain: Secondary | ICD-10-CM | POA: Diagnosis not present

## 2019-05-09 DIAGNOSIS — 419620001 Death: Secondary | SNOMED CT | POA: Diagnosis not present

## 2019-05-09 NOTE — Telephone Encounter (Signed)
Tried to call patient back again and wife answered, stated he had just had a heart attack a few hours ago and passed away.

## 2019-05-09 NOTE — Telephone Encounter (Signed)
Patient called in and is upset he has not heard anything from the office. He is having phone issues but would like to speak with someone today, preferably PCP. Please advise. Please call [phone].

## 2019-05-09 NOTE — Telephone Encounter (Signed)
Left another vm, will try again later

## 2019-05-09 DEATH — deceased

## 2019-05-20 IMAGING — CR DG CHEST 2V
1 series · 2 of 2 positions shown · non-contrast
Comparison: 10/11/2014

CLINICAL DATA: Wheezing.  Short of breath.

EXAM:
CHEST - 2 VIEW

[Series 1: dg chest 2 view · 0.14mm/px · 2 of 2 slices shown]
[im 1/2]
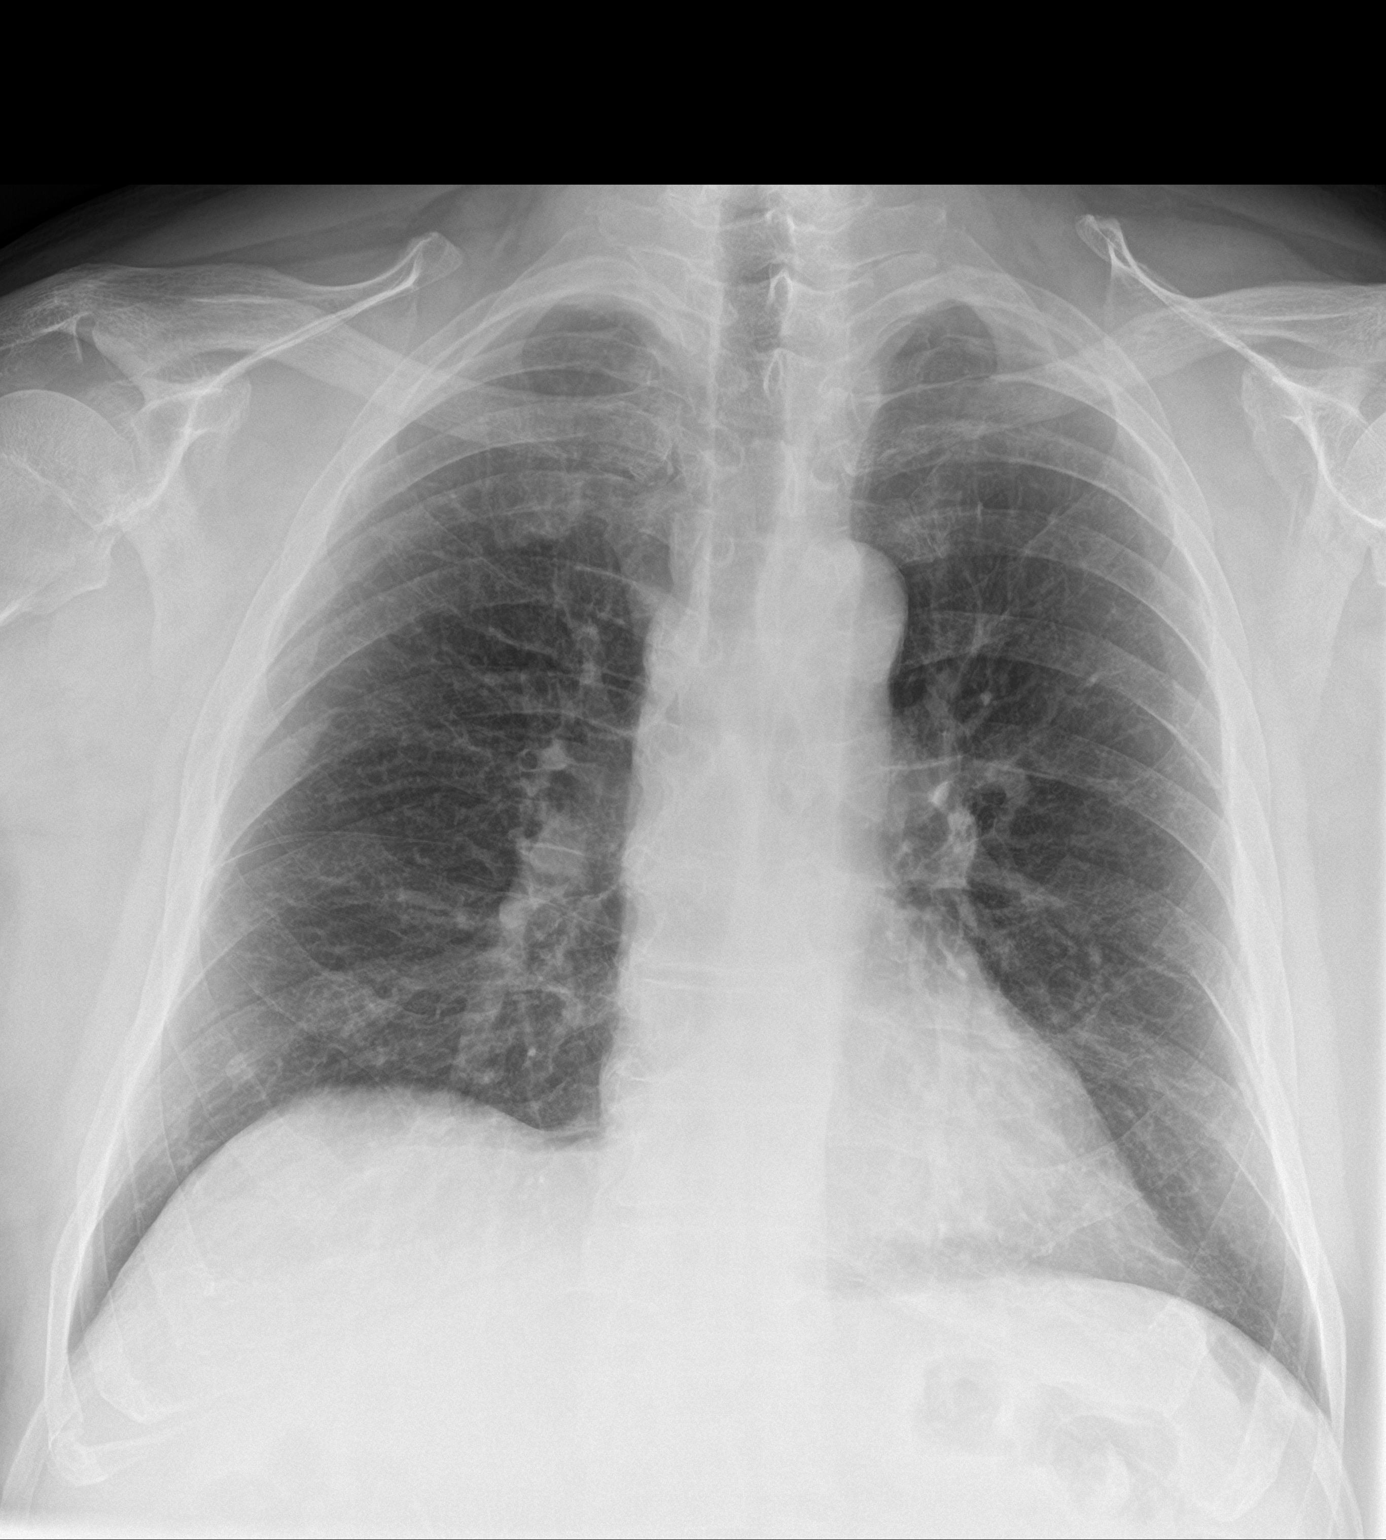
[im 2/2]
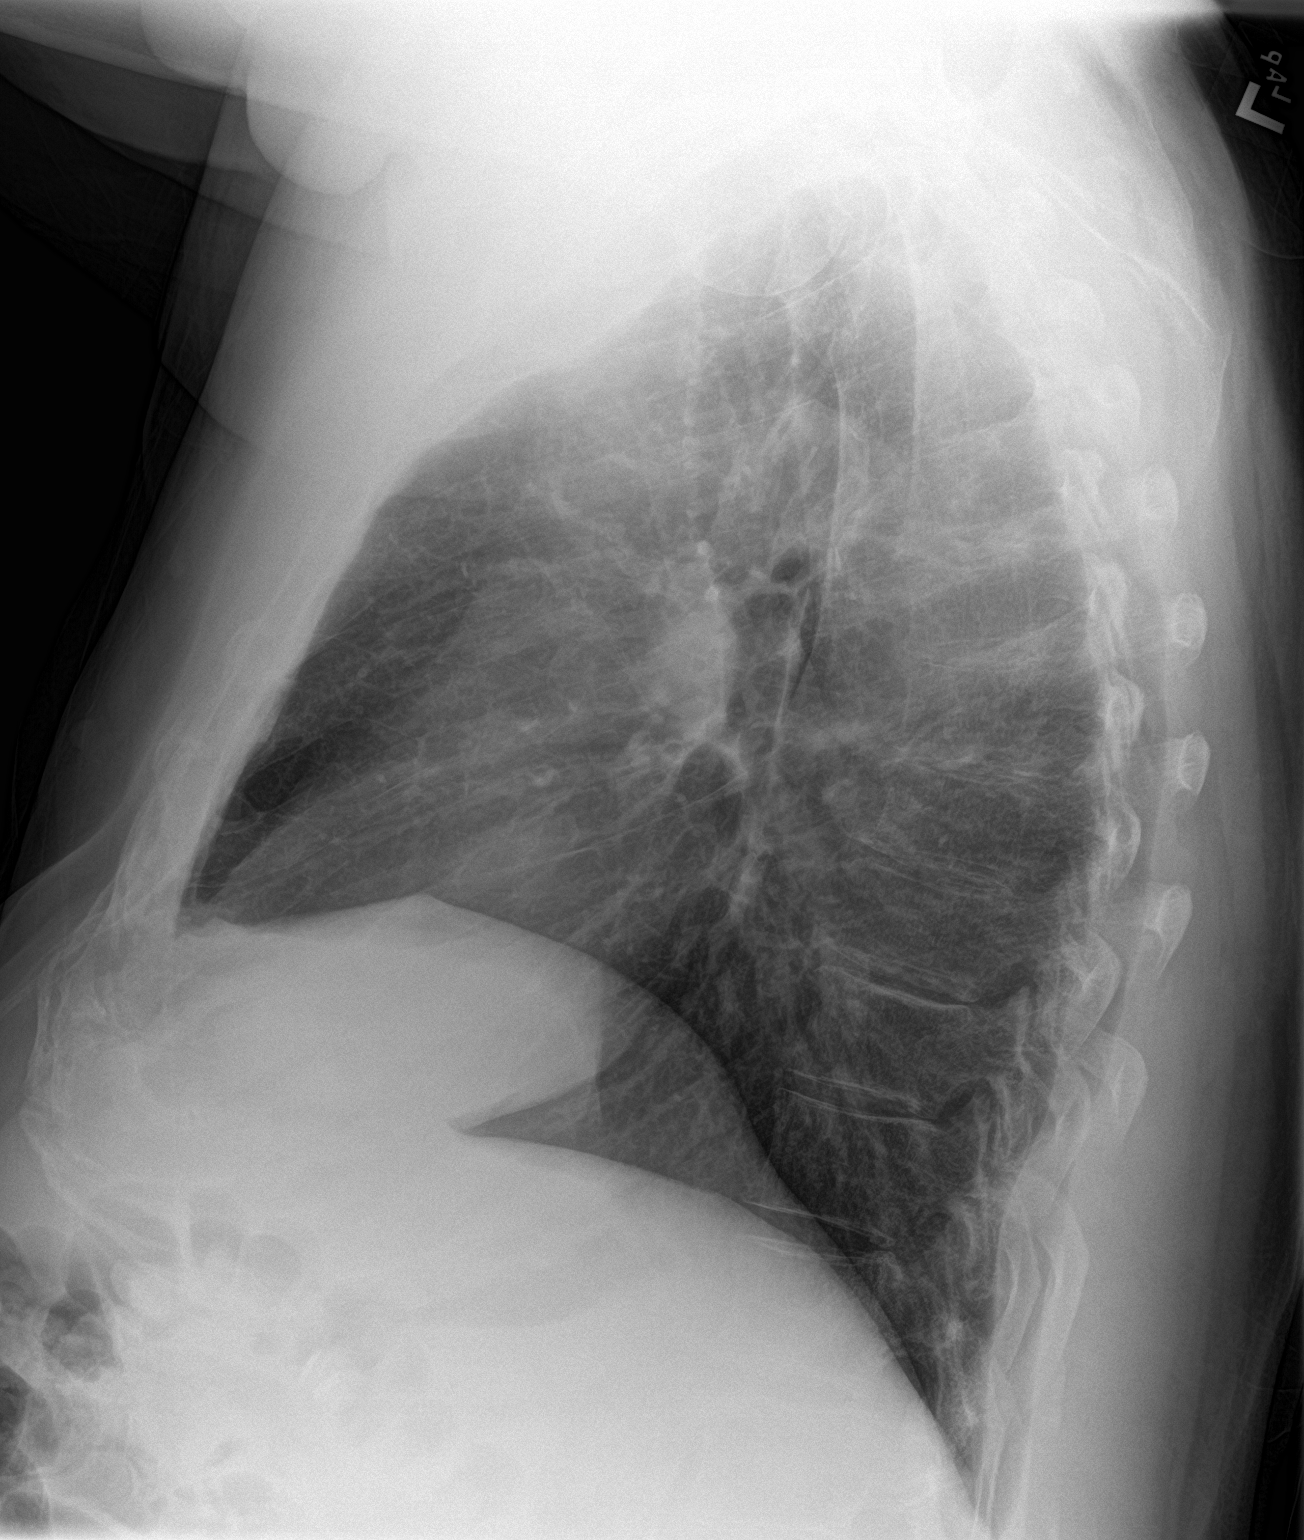

[2 of 2 positions shown; findings below may reference images not displayed]

FINDINGS: Normal heart size. Nipple shadow at the right lung base is stable.
Lungs are clear. No pneumothorax. No pleural effusion.
IMPRESSION: No active cardiopulmonary disease.

## 2019-07-04 ENCOUNTER — Telehealth: Payer: Self-pay | Admitting: Family Medicine

## 2019-07-04 NOTE — Chronic Care Management (AMB) (Signed)
  Care Management   Note  07/04/2019 Name: Drew Morgan MRN: [id number] DOB: [birth year]  Drew Morgan is a 79 y.o. year old male who is a primary care patient of Sander Radon and is actively engaged with the care management team. I reached out to Drew Morgan by phone today to assist with scheduling an initial visit with the Pharmacist  Follow up plan: Unsuccessful telephone outreach attempt made. A HIPPA compliant phone message was left for the patient providing contact information and requesting a return call.  The care management team will reach out to the patient again over the next 7 days.  If patient returns call to provider office, please advise to call Embedded Care Management Care Guide Penne Lash  at [phone]  Penne Lash, RMA Care Guide, Embedded Care Coordination [facility]  [place], [place] [address] Direct Dial: [phone] [email or web address]: [email or web address]

## 2019-07-08 NOTE — Chronic Care Management (AMB) (Signed)
  Care Management   Note  07/08/2019 Name: HERNANDO REALI MRN: [id number] DOB: [birth year]  [name] is a 79 y.o. year old male who is a primary care patient of Sander Radon and is actively engaged with the care management team. I reached out to [name] by phone today to assist with scheduling an initial visit with the Pharmacist  Follow up plan: Unsuccessful telephone outreach attempt made. A HIPPA compliant phone message was left for the patient providing contact information and requesting a return call.  The care management team will reach out to the patient again over the next 7 days.  If patient returns call to provider office, please advise to call Embedded Care Management Care Guide Penne Lash  at [phone]  Penne Lash, RMA Care Guide, Embedded Care Coordination [facility]  [place], [place] [address] Direct Dial: [phone] [email or web address] Website: [email or web address]

## 2019-07-12 NOTE — Chronic Care Management (AMB) (Signed)
  Care Management   Note  07/12/2019 Name: [name] MRN: [id number] DOB: Jun 08, 1940  Drew Morgan is a 79 y.o. year old male who is a primary care patient of Sander Radon and is actively engaged with the care management team. I reached out to Drew Morgan by phone today to assist with scheduling an initial visit with the Pharmacist  Follow up plan: Unable to make contact on outreach attempts x 3. PCP Danelle Berry PA notified via routed documentation in medical record.   Penne Lash, RMA Care Guide, Embedded Care Coordination [facility]  [place], [place] [address] Direct Dial: [phone] [email or web address] Website: [email or web address]

## 2019-07-14 ENCOUNTER — Telehealth: Payer: Self-pay | Admitting: Family Medicine

## 2019-07-14 NOTE — Telephone Encounter (Signed)
Pt is deceased. 

## 2019-07-14 NOTE — Progress Notes (Signed)
Pt is deceased. 

## 2020-09-11 IMAGING — CR DG ELBOW COMPLETE 3+V*L*
1 series · 4 of 4 positions shown · non-contrast
Comparison: None.

CLINICAL DATA: Left elbow swelling after injury 4 months ago.

EXAM:
LEFT ELBOW - COMPLETE 3+ VIEW

[Series 1: dg elbow complete left (3+view) · 0.14mm/px · 4 of 4 slices shown]
[im 1/4]
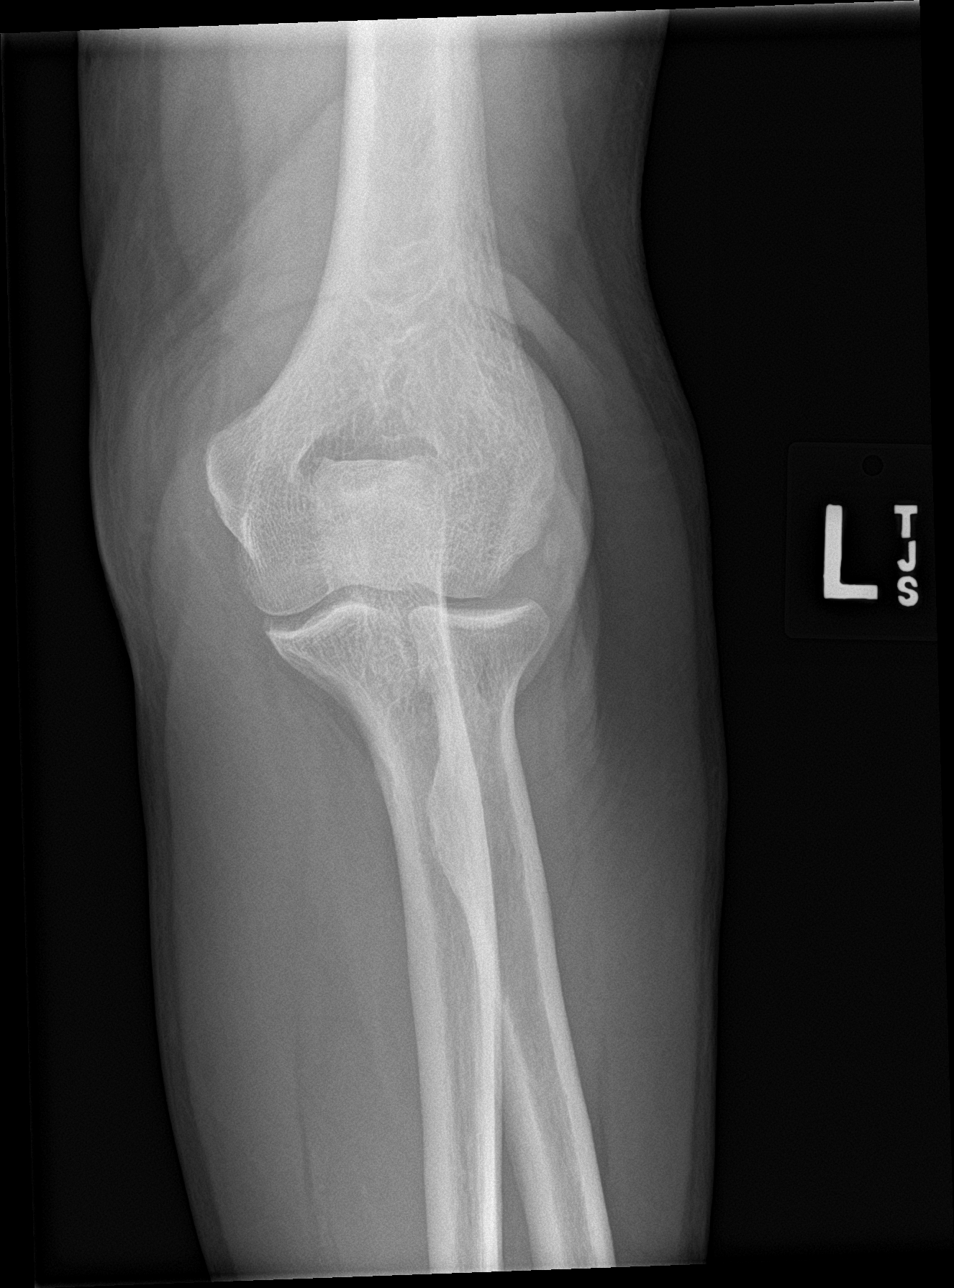
[im 2/4]
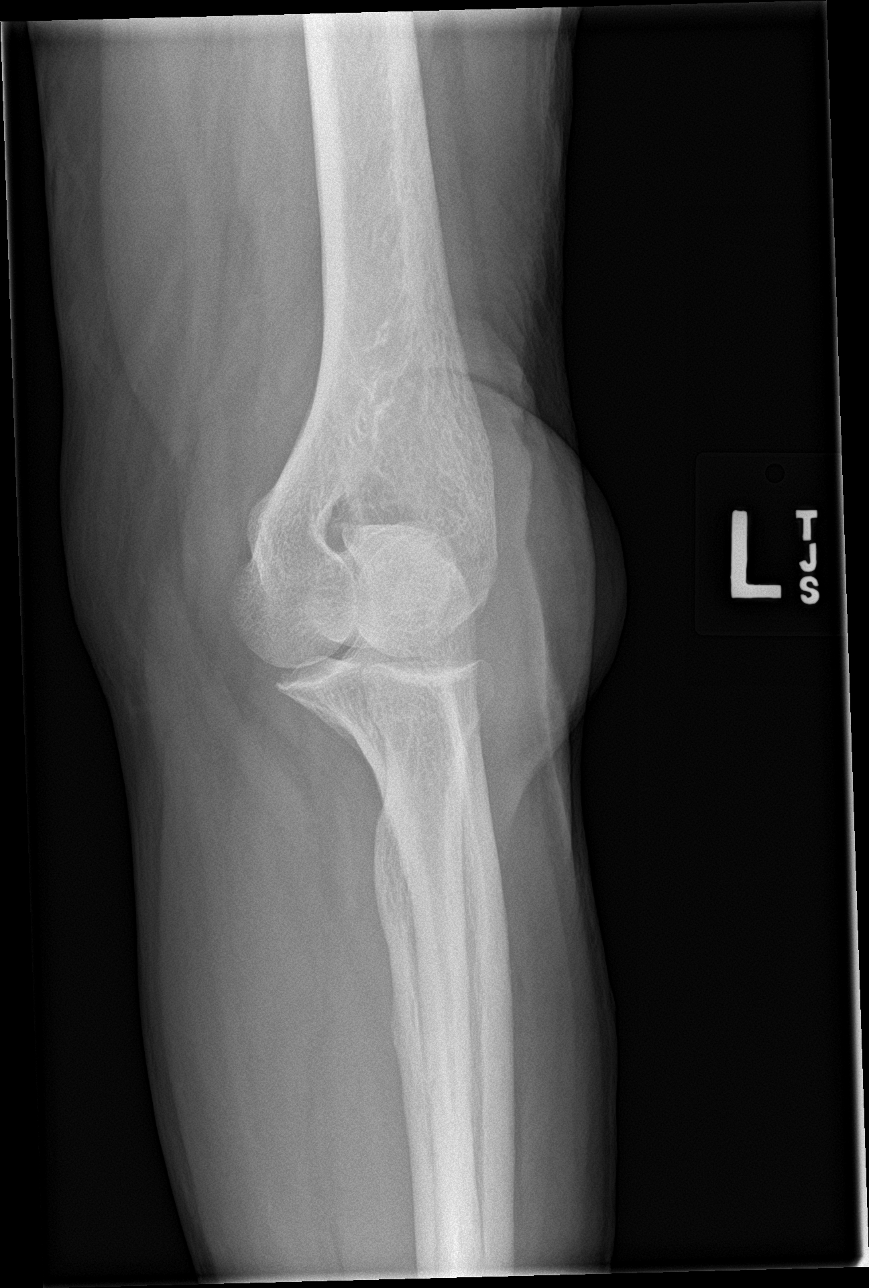
[im 3/4]
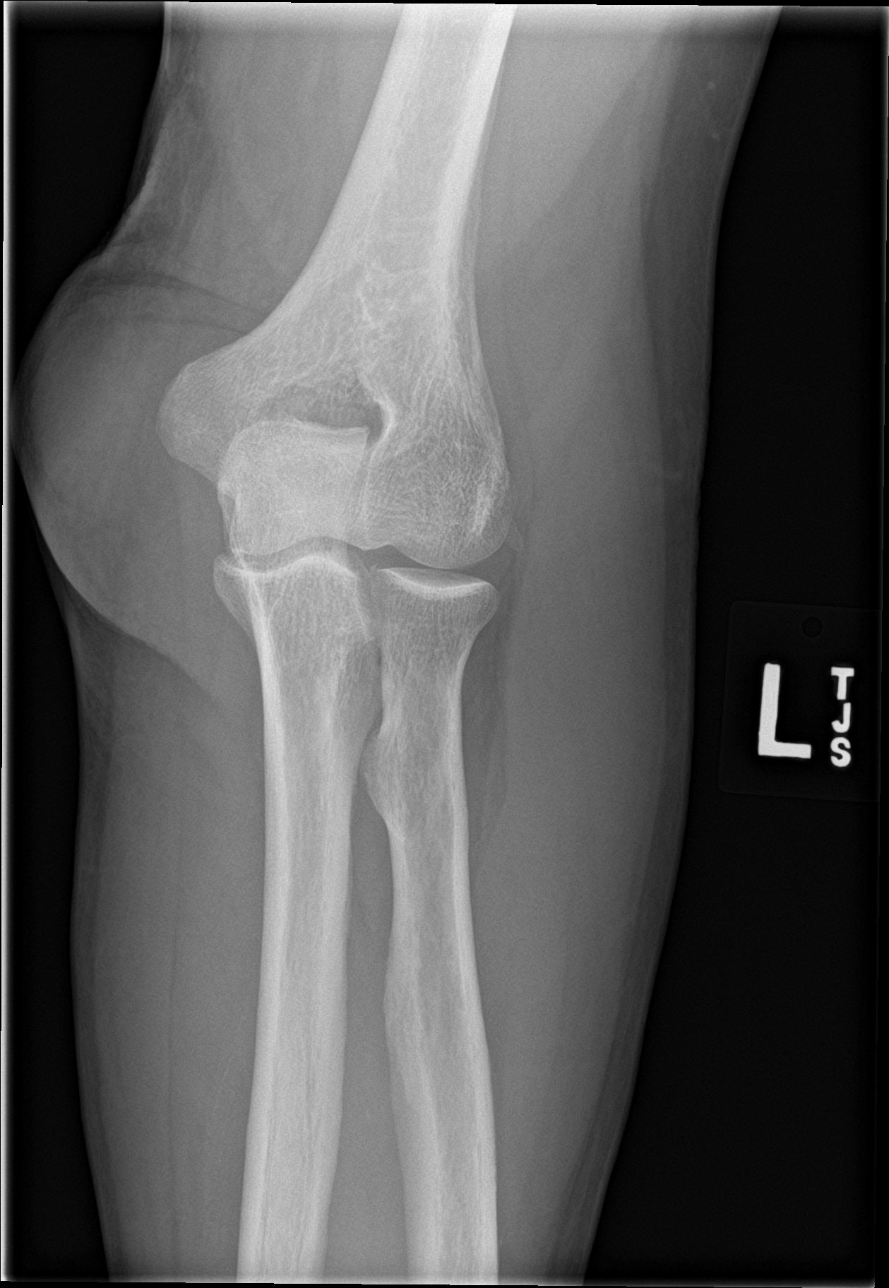
[im 4/4]
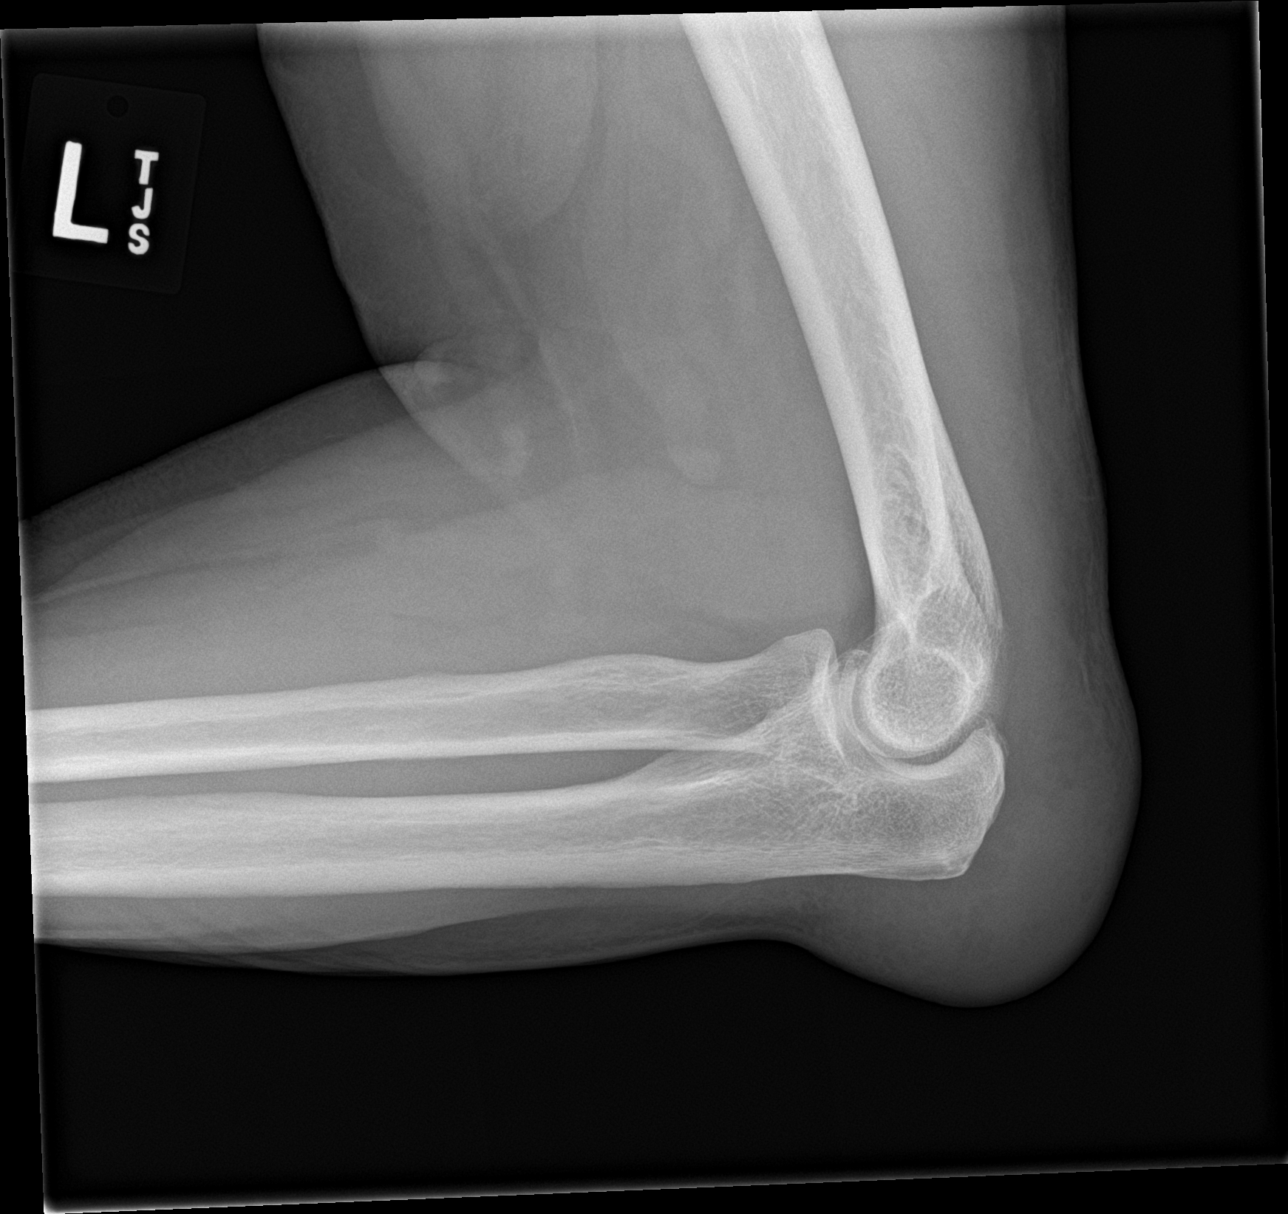

[4 of 4 positions shown; findings below may reference images not displayed]

FINDINGS: There is no evidence of fracture, dislocation, or joint effusion.
There is no evidence of arthropathy or other focal bone abnormality.
Soft tissue swelling is seen overlying the olecranon.
IMPRESSION: No fracture or dislocation is noted. Soft tissue swelling is seen
over the olecranon which may be posttraumatic in etiology.

## 2021-01-11 IMAGING — CR DG CHEST 2V
1 series · 2 of 2 positions shown · non-contrast
Comparison: Chest radiograph dated 07/30/2017.

CLINICAL DATA: 78-year-old male with shortness of breath.

EXAM:
CHEST - 2 VIEW

[Series 1: w chest lat · 0.14mm/px · 2 of 2 slices shown]
[im 1/2]
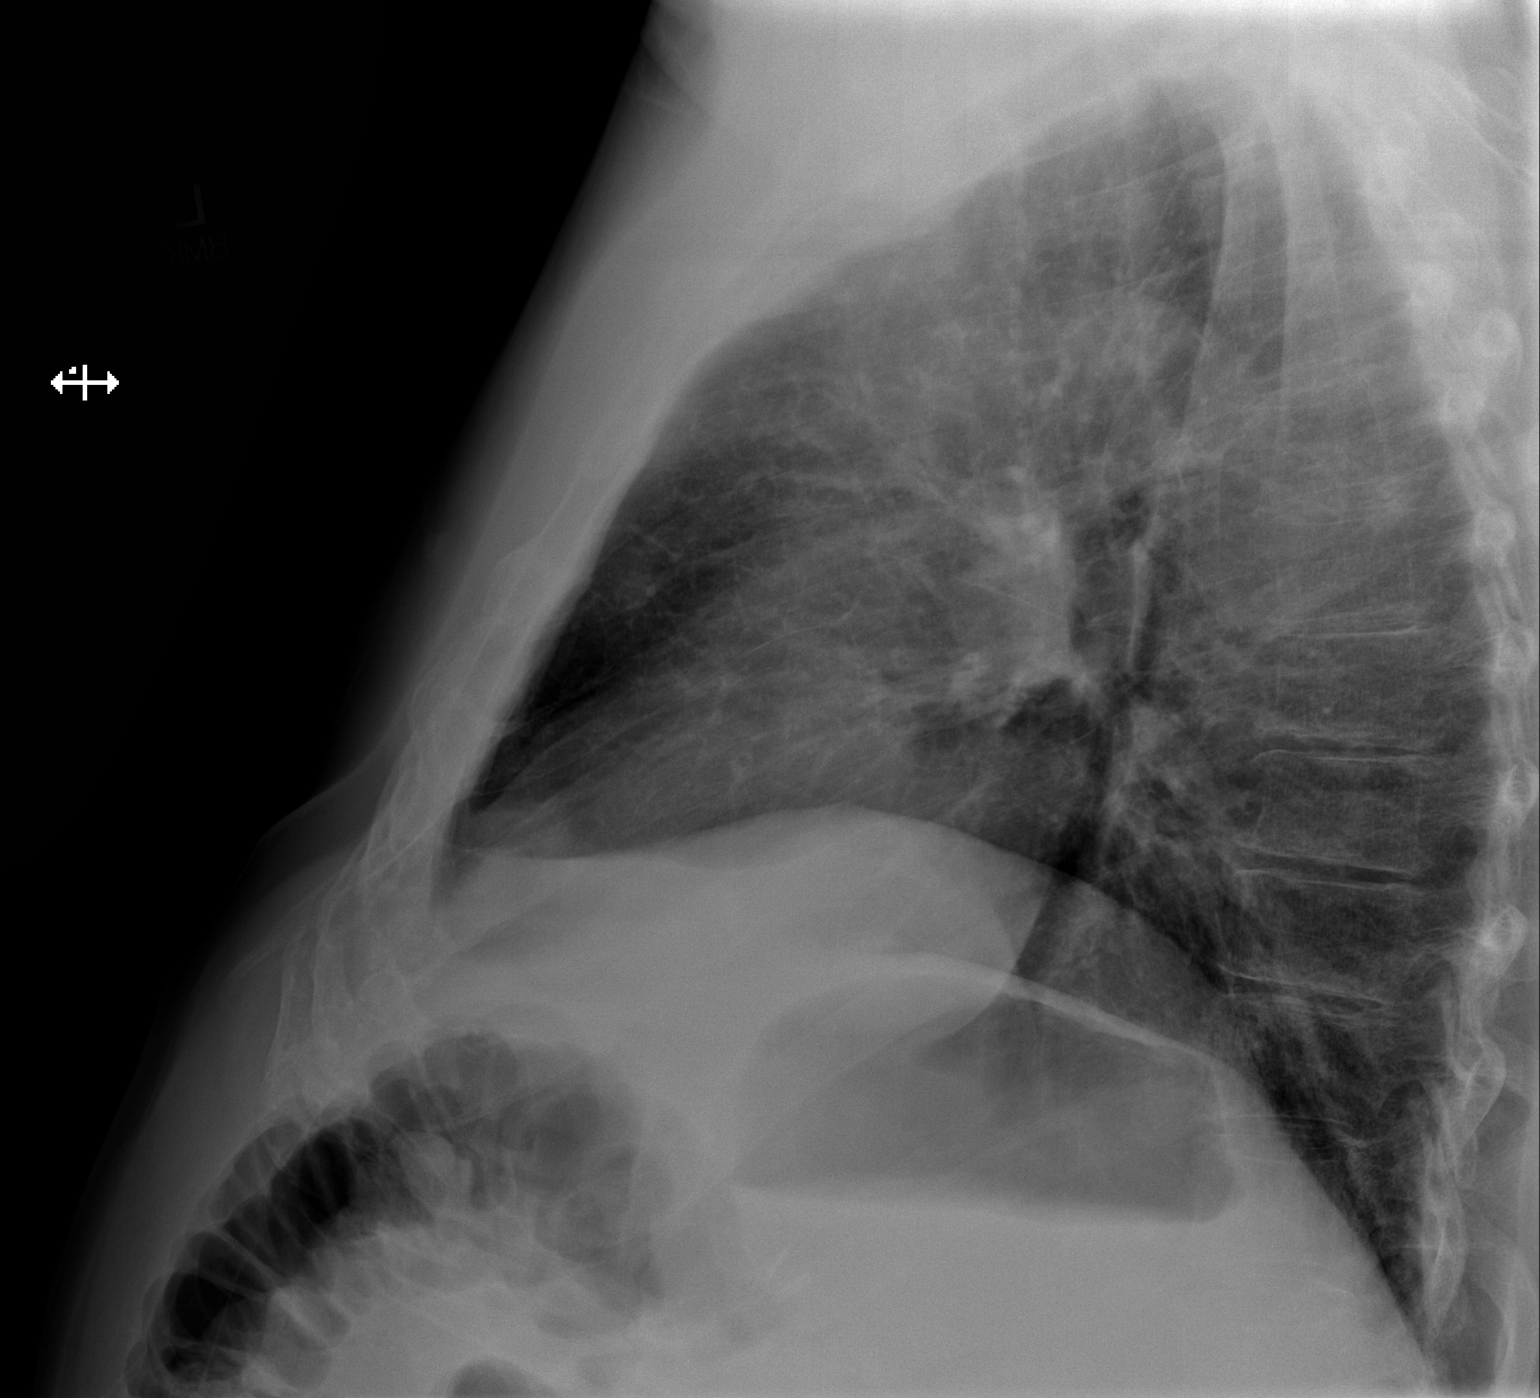
[im 2/2]
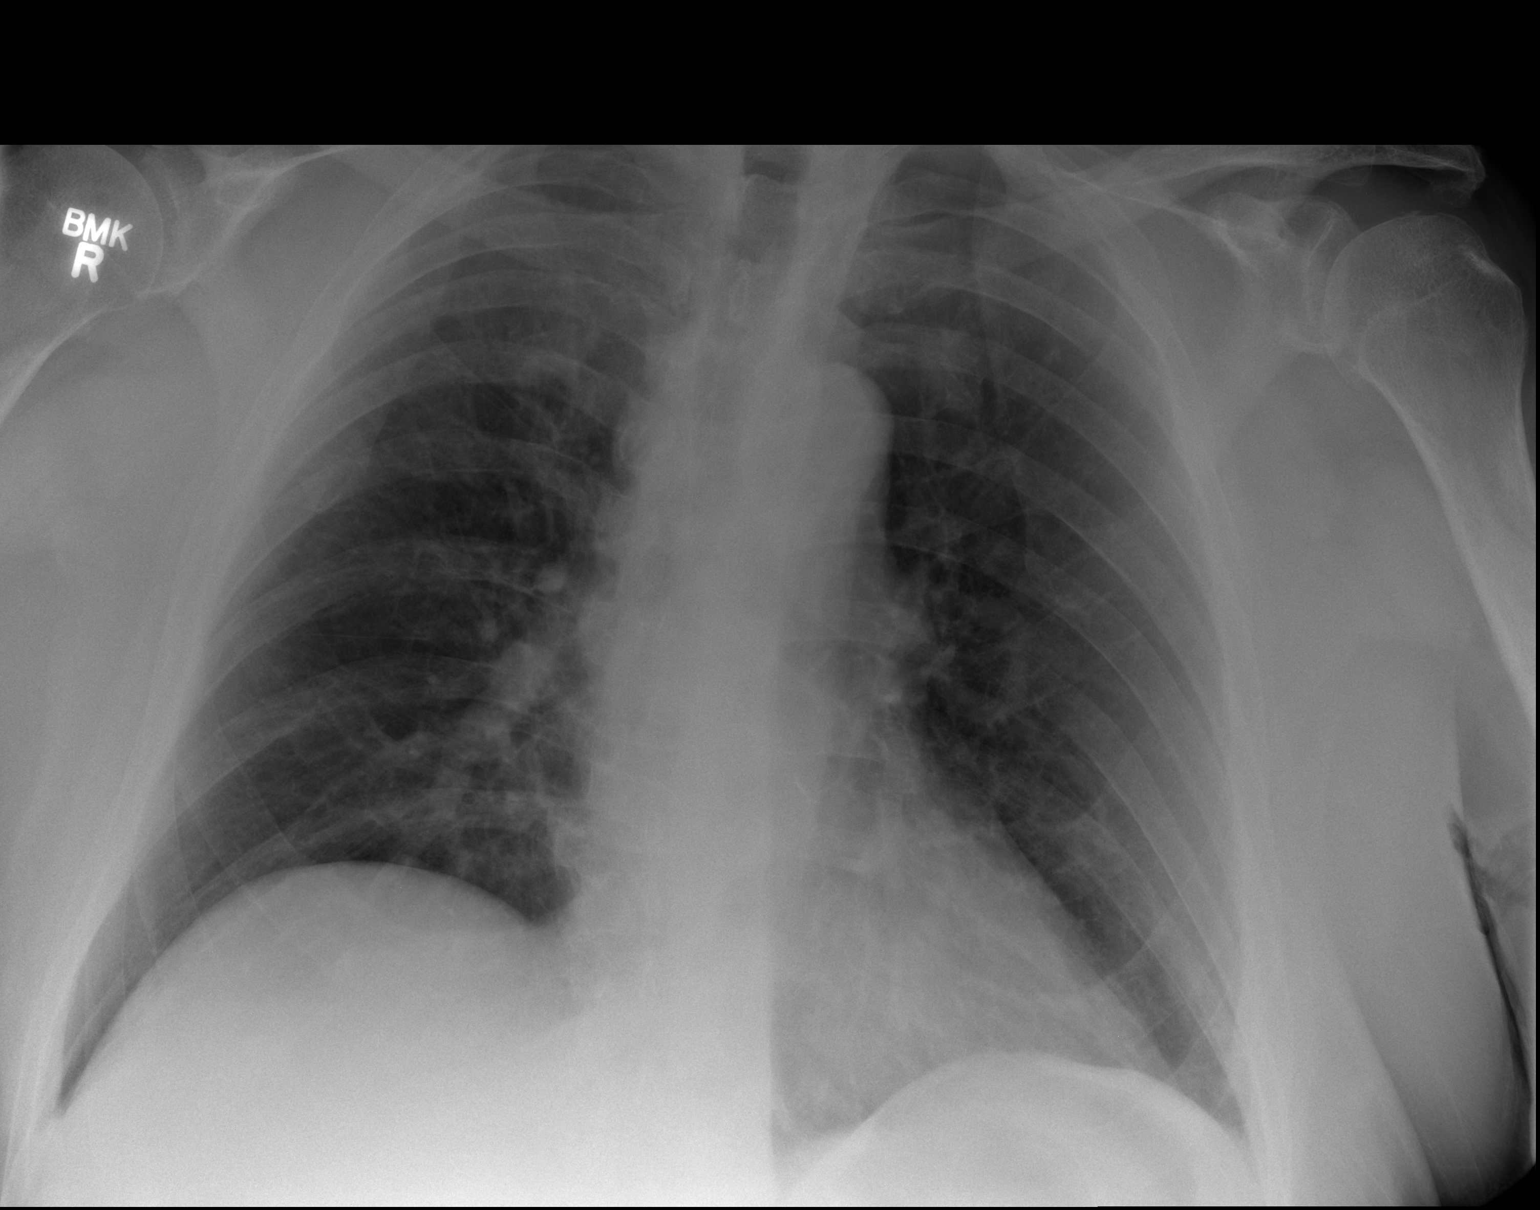

[2 of 2 positions shown; findings below may reference images not displayed]

FINDINGS: Minimal faint densities bilaterally likely artifactual and
corresponding to the superimposition of the costochondral junction.
There is no focal consolidation, pleural effusion, pneumothorax.
Stable cardiac silhouette. No acute osseous pathology.
IMPRESSION: No focal consolidation.

## 2022-10-03 NOTE — Progress Notes (Deleted)
Past nurse visit completed, but unable to close chart previously Drew Berry, PA-C
